# Patient Record
Sex: Female | Born: 1955 | Race: White | Hispanic: No | Marital: Married | Smoking: Never smoker
Health system: Southern US, Community
[De-identification: ages and names within clinical notes are randomized; demographics above are authoritative.]

## PROBLEM LIST (undated history)

## (undated) DIAGNOSIS — T7840XA Allergy, unspecified, initial encounter: Secondary | ICD-10-CM

## (undated) DIAGNOSIS — H409 Unspecified glaucoma: Secondary | ICD-10-CM

## (undated) DIAGNOSIS — E0500 Thyrotoxicosis with diffuse goiter without thyrotoxic crisis or storm: Secondary | ICD-10-CM

## (undated) HISTORY — DX: Allergy, unspecified, initial encounter: T78.40XA

## (undated) HISTORY — PX: COLONOSCOPY: SHX174

## (undated) HISTORY — PX: CARPAL TUNNEL RELEASE: SHX101

## (undated) HISTORY — DX: Thyrotoxicosis with diffuse goiter without thyrotoxic crisis or storm: E05.00

## (undated) HISTORY — DX: Unspecified glaucoma: H40.9

---
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
## 1982-02-07 HISTORY — PX: THORACOTOMY: SUR1349

## 1999-07-23 ENCOUNTER — Encounter: Payer: Self-pay | Admitting: Emergency Medicine

## 1999-07-23 ENCOUNTER — Emergency Department (HOSPITAL_COMMUNITY): Admission: EM | Admit: 1999-07-23 | Discharge: 1999-07-23 | Payer: Self-pay | Admitting: Emergency Medicine

## 2006-01-13 ENCOUNTER — Ambulatory Visit: Payer: Self-pay | Admitting: Internal Medicine

## 2006-01-13 LAB — CONVERTED CEMR LAB
ALT: 24 units/L (ref 0–40)
AST: 26 units/L (ref 0–37)
Albumin: 3.5 g/dL (ref 3.5–5.2)
Alkaline Phosphatase: 52 units/L (ref 39–117)
BUN: 9 mg/dL (ref 6–23)
Basophils Absolute: 0 10*3/uL (ref 0.0–0.1)
Basophils Relative: 0.5 % (ref 0.0–1.0)
CO2: 25 meq/L (ref 19–32)
Calcium: 9.2 mg/dL (ref 8.4–10.5)
Chloride: 106 meq/L (ref 96–112)
Chol/HDL Ratio, serum: 3.4
Cholesterol: 127 mg/dL (ref 0–200)
Creatinine, Ser: 0.6 mg/dL (ref 0.4–1.2)
Eosinophil percent: 1.3 % (ref 0.0–5.0)
GFR calc non Af Amer: 113 mL/min
Glomerular Filtration Rate, Af Am: 137 mL/min/{1.73_m2}
Glucose, Bld: 92 mg/dL (ref 70–99)
HCT: 42.1 % (ref 36.0–46.0)
HDL: 37.3 mg/dL — ABNORMAL LOW (ref >39.0)
Hemoglobin: 14.3 g/dL (ref 12.0–15.0)
LDL Cholesterol: 79 mg/dL (ref 0–99)
Lymphocytes Relative: 32 % (ref 12.0–46.0)
MCHC: 33.9 g/dL (ref 30.0–36.0)
MCV: 89.6 fL (ref 78.0–100.0)
Monocytes Absolute: 0.5 10*3/uL (ref 0.2–0.7)
Monocytes Relative: 9.2 % (ref 3.0–11.0)
Neutro Abs: 3.2 10*3/uL (ref 1.4–7.7)
Neutrophils Relative %: 57 % (ref 43.0–77.0)
Platelets: 298 10*3/uL (ref 150–400)
Potassium: 4 meq/L (ref 3.5–5.1)
RBC: 4.7 M/uL (ref 3.87–5.11)
RDW: 12 % (ref 11.5–14.6)
Sodium: 138 meq/L (ref 135–145)
TSH: 0.01 microintl units/mL — ABNORMAL LOW (ref 0.35–5.50)
Total Bilirubin: 1 mg/dL (ref 0.3–1.2)
Total Protein: 6.6 g/dL (ref 6.0–8.3)
Triglyceride fasting, serum: 53 mg/dL (ref 0–149)
VLDL: 11 mg/dL (ref 0–40)
WBC: 5.6 10*3/uL (ref 4.5–10.5)

## 2006-01-20 ENCOUNTER — Ambulatory Visit: Payer: Self-pay | Admitting: Internal Medicine

## 2006-02-02 ENCOUNTER — Encounter (HOSPITAL_COMMUNITY): Admission: RE | Admit: 2006-02-02 | Discharge: 2006-04-18 | Payer: Self-pay | Admitting: Internal Medicine

## 2006-02-14 ENCOUNTER — Ambulatory Visit: Payer: Self-pay | Admitting: Internal Medicine

## 2006-03-03 ENCOUNTER — Ambulatory Visit: Payer: Self-pay | Admitting: Internal Medicine

## 2006-03-03 LAB — CONVERTED CEMR LAB
Basophils Absolute: 0 10*3/uL (ref 0.0–0.1)
Basophils Relative: 0.3 % (ref 0.0–1.0)
Eosinophils Absolute: 0.1 10*3/uL (ref 0.0–0.6)
Eosinophils Relative: 1.8 % (ref 0.0–5.0)
Free T4: 1.7 ng/dL — ABNORMAL HIGH (ref 0.6–1.6)
HCT: 45 % (ref 36.0–46.0)
Hemoglobin: 15.5 g/dL — ABNORMAL HIGH (ref 12.0–15.0)
Lymphocytes Relative: 23.4 % (ref 12.0–46.0)
MCHC: 34.5 g/dL (ref 30.0–36.0)
MCV: 87.9 fL (ref 78.0–100.0)
Monocytes Absolute: 0.5 10*3/uL (ref 0.2–0.7)
Monocytes Relative: 8.1 % (ref 3.0–11.0)
Neutro Abs: 4.1 10*3/uL (ref 1.4–7.7)
Neutrophils Relative %: 66.4 % (ref 43.0–77.0)
Platelets: 281 10*3/uL (ref 150–400)
RBC: 5.12 M/uL — ABNORMAL HIGH (ref 3.87–5.11)
RDW: 11.7 % (ref 11.5–14.6)
TSH: 0.01 microintl units/mL — ABNORMAL LOW (ref 0.35–5.50)
WBC: 6.1 10*3/uL (ref 4.5–10.5)

## 2006-03-13 ENCOUNTER — Ambulatory Visit: Payer: Self-pay | Admitting: Internal Medicine

## 2006-03-31 ENCOUNTER — Ambulatory Visit: Payer: Self-pay | Admitting: Internal Medicine

## 2006-03-31 LAB — CONVERTED CEMR LAB
Free T4: 1.4 ng/dL (ref 0.6–1.6)
T3, Free: 5.6 pg/mL — ABNORMAL HIGH (ref 2.3–4.2)
TSH: 0.05 microintl units/mL — ABNORMAL LOW (ref 0.35–5.50)

## 2006-04-04 ENCOUNTER — Ambulatory Visit: Payer: Self-pay | Admitting: Internal Medicine

## 2006-05-05 ENCOUNTER — Ambulatory Visit: Payer: Self-pay | Admitting: Internal Medicine

## 2006-05-05 LAB — CONVERTED CEMR LAB
Free T4: 1.1 ng/dL (ref 0.6–1.6)
T3, Free: 4.4 pg/mL — ABNORMAL HIGH (ref 2.3–4.2)
TSH: 0.03 u[IU]/mL — ABNORMAL LOW (ref 0.35–5.50)

## 2006-06-27 ENCOUNTER — Ambulatory Visit: Payer: Self-pay | Admitting: Internal Medicine

## 2006-06-27 LAB — CONVERTED CEMR LAB: TSH: 0.05 microintl units/mL — ABNORMAL LOW (ref 0.35–5.50)

## 2006-06-30 ENCOUNTER — Ambulatory Visit: Payer: Self-pay | Admitting: Internal Medicine

## 2006-08-01 ENCOUNTER — Ambulatory Visit: Payer: Self-pay | Admitting: Internal Medicine

## 2006-08-01 LAB — CONVERTED CEMR LAB
Free T4: 0.6 ng/dL (ref 0.6–1.6)
T3, Free: 3.2 pg/mL (ref 2.3–4.2)
TSH: 0.04 microintl units/mL — ABNORMAL LOW (ref 0.35–5.50)

## 2006-08-08 ENCOUNTER — Ambulatory Visit: Payer: Self-pay | Admitting: Internal Medicine

## 2006-09-20 ENCOUNTER — Ambulatory Visit: Payer: Self-pay | Admitting: Internal Medicine

## 2006-09-22 LAB — CONVERTED CEMR LAB
Free T4: 0.5 ng/dL — ABNORMAL LOW (ref 0.6–1.6)
T3, Free: 2.8 pg/mL (ref 2.3–4.2)
TSH: 0.04 microintl units/mL — ABNORMAL LOW (ref 0.35–5.50)

## 2006-11-13 ENCOUNTER — Ambulatory Visit: Payer: Self-pay | Admitting: Internal Medicine

## 2006-11-13 LAB — CONVERTED CEMR LAB
Free T4: 0.4 ng/dL — ABNORMAL LOW (ref 0.6–1.6)
T3, Free: 2.4 pg/mL (ref 2.3–4.2)
TSH: 2.33 microintl units/mL (ref 0.35–5.50)

## 2006-11-21 ENCOUNTER — Ambulatory Visit: Payer: Self-pay | Admitting: Internal Medicine

## 2006-12-18 ENCOUNTER — Ambulatory Visit: Payer: Self-pay | Admitting: Internal Medicine

## 2006-12-19 LAB — CONVERTED CEMR LAB
Free T4: 0.7 ng/dL (ref 0.6–1.6)
T3, Free: 2.9 pg/mL (ref 2.3–4.2)
TSH: 0.5 microintl units/mL (ref 0.35–5.50)

## 2007-01-15 ENCOUNTER — Ambulatory Visit: Payer: Self-pay | Admitting: Internal Medicine

## 2007-01-15 LAB — CONVERTED CEMR LAB
Free T4: 0.9 ng/dL (ref 0.6–1.6)
T3, Free: 3.5 pg/mL (ref 2.3–4.2)
TSH: 0.1 microintl units/mL — ABNORMAL LOW (ref 0.35–5.50)

## 2007-01-22 ENCOUNTER — Ambulatory Visit: Payer: Self-pay | Admitting: Internal Medicine

## 2007-01-24 DIAGNOSIS — E0500 Thyrotoxicosis with diffuse goiter without thyrotoxic crisis or storm: Secondary | ICD-10-CM

## 2007-01-24 HISTORY — DX: Thyrotoxicosis with diffuse goiter without thyrotoxic crisis or storm: E05.00

## 2007-03-05 ENCOUNTER — Ambulatory Visit: Payer: Self-pay | Admitting: Internal Medicine

## 2007-03-06 LAB — CONVERTED CEMR LAB: Free T4: 0.7 ng/dL (ref 0.6–1.6)

## 2007-05-01 ENCOUNTER — Ambulatory Visit: Payer: Self-pay | Admitting: Internal Medicine

## 2007-05-01 LAB — CONVERTED CEMR LAB: T3, Free: 3 pg/mL (ref 2.3–4.2)

## 2007-05-09 ENCOUNTER — Ambulatory Visit: Payer: Self-pay | Admitting: Internal Medicine

## 2007-06-20 ENCOUNTER — Ambulatory Visit: Payer: Self-pay | Admitting: Internal Medicine

## 2007-06-25 LAB — CONVERTED CEMR LAB
Free T4: 1 ng/dL (ref 0.6–1.6)
T3, Free: 3.6 pg/mL (ref 2.3–4.2)
TSH: 0.08 microintl units/mL — ABNORMAL LOW (ref 0.35–5.50)

## 2007-07-03 ENCOUNTER — Ambulatory Visit: Payer: Self-pay | Admitting: Internal Medicine

## 2007-07-04 LAB — CONVERTED CEMR LAB: TSH: 0.04 microintl units/mL — ABNORMAL LOW (ref 0.35–5.50)

## 2007-08-27 ENCOUNTER — Ambulatory Visit: Payer: Self-pay | Admitting: Internal Medicine

## 2007-09-04 ENCOUNTER — Ambulatory Visit: Payer: Self-pay | Admitting: Internal Medicine

## 2007-09-04 ENCOUNTER — Telehealth: Payer: Self-pay | Admitting: Internal Medicine

## 2007-11-12 ENCOUNTER — Encounter: Payer: Self-pay | Admitting: Internal Medicine

## 2007-11-12 ENCOUNTER — Ambulatory Visit: Payer: Self-pay | Admitting: Internal Medicine

## 2007-11-28 ENCOUNTER — Ambulatory Visit: Payer: Self-pay | Admitting: Internal Medicine

## 2007-11-28 LAB — CONVERTED CEMR LAB: TSH: 0.13 microintl units/mL — ABNORMAL LOW (ref 0.35–5.50)

## 2007-12-04 ENCOUNTER — Ambulatory Visit: Payer: Self-pay | Admitting: Internal Medicine

## 2008-02-26 ENCOUNTER — Ambulatory Visit: Payer: Self-pay | Admitting: Internal Medicine

## 2008-02-26 LAB — CONVERTED CEMR LAB
Free T4: 1 ng/dL (ref 0.6–1.6)
T3, Free: 3.2 pg/mL (ref 2.3–4.2)
TSH: 0.06 microintl units/mL — ABNORMAL LOW (ref 0.35–5.50)

## 2008-03-04 ENCOUNTER — Ambulatory Visit: Payer: Self-pay | Admitting: Internal Medicine

## 2008-08-12 ENCOUNTER — Ambulatory Visit: Payer: Self-pay | Admitting: Internal Medicine

## 2008-08-12 LAB — CONVERTED CEMR LAB: TSH: 0.04 microintl units/mL — ABNORMAL LOW (ref 0.35–5.50)

## 2008-08-25 ENCOUNTER — Ambulatory Visit: Payer: Self-pay | Admitting: Internal Medicine

## 2008-09-17 ENCOUNTER — Ambulatory Visit: Payer: Self-pay | Admitting: Gastroenterology

## 2008-09-29 ENCOUNTER — Ambulatory Visit: Payer: Self-pay | Admitting: Gastroenterology

## 2008-09-29 ENCOUNTER — Encounter: Payer: Self-pay | Admitting: Gastroenterology

## 2008-10-01 ENCOUNTER — Encounter: Payer: Self-pay | Admitting: Internal Medicine

## 2008-10-08 ENCOUNTER — Encounter: Payer: Self-pay | Admitting: Gastroenterology

## 2009-02-12 ENCOUNTER — Encounter: Payer: Self-pay | Admitting: Internal Medicine

## 2009-02-23 ENCOUNTER — Ambulatory Visit: Payer: Self-pay | Admitting: Internal Medicine

## 2009-02-27 ENCOUNTER — Ambulatory Visit: Payer: Self-pay | Admitting: Internal Medicine

## 2009-03-20 ENCOUNTER — Ambulatory Visit: Payer: Self-pay | Admitting: Internal Medicine

## 2009-04-14 ENCOUNTER — Telehealth: Payer: Self-pay | Admitting: Internal Medicine

## 2009-04-27 ENCOUNTER — Encounter: Payer: Self-pay | Admitting: Internal Medicine

## 2009-05-26 ENCOUNTER — Ambulatory Visit: Payer: Self-pay | Admitting: Internal Medicine

## 2009-05-27 LAB — CONVERTED CEMR LAB
Free T4: 0.9 ng/dL (ref 0.6–1.6)
T3, Free: 3.2 pg/mL (ref 2.3–4.2)
TSH: 0.21 microintl units/mL — ABNORMAL LOW (ref 0.35–5.50)

## 2009-08-18 ENCOUNTER — Telehealth (INDEPENDENT_AMBULATORY_CARE_PROVIDER_SITE_OTHER): Payer: Self-pay | Admitting: *Deleted

## 2009-08-24 ENCOUNTER — Ambulatory Visit: Payer: Self-pay | Admitting: Internal Medicine

## 2009-08-24 LAB — CONVERTED CEMR LAB
Cholesterol: 173 mg/dL (ref 0–200)
Free T4: 0.73 ng/dL (ref 0.60–1.60)
T3, Free: 2.7 pg/mL (ref 2.3–4.2)
TSH: 1.51 microintl units/mL (ref 0.35–5.50)

## 2009-08-25 ENCOUNTER — Emergency Department (HOSPITAL_COMMUNITY): Admission: EM | Admit: 2009-08-25 | Discharge: 2009-08-25 | Payer: Self-pay | Admitting: Emergency Medicine

## 2009-08-25 ENCOUNTER — Telehealth: Payer: Self-pay | Admitting: Internal Medicine

## 2009-09-09 ENCOUNTER — Ambulatory Visit: Payer: Self-pay | Admitting: Internal Medicine

## 2009-09-18 ENCOUNTER — Telehealth: Payer: Self-pay | Admitting: *Deleted

## 2009-10-13 ENCOUNTER — Ambulatory Visit: Payer: Self-pay | Admitting: Internal Medicine

## 2009-12-14 ENCOUNTER — Ambulatory Visit: Payer: Self-pay | Admitting: Internal Medicine

## 2009-12-14 LAB — CONVERTED CEMR LAB
T3, Free: 2.7 pg/mL (ref 2.3–4.2)
TSH: 0.57 microintl units/mL (ref 0.35–5.50)

## 2009-12-21 ENCOUNTER — Ambulatory Visit: Payer: Self-pay | Admitting: Internal Medicine

## 2010-01-20 ENCOUNTER — Encounter: Payer: Self-pay | Admitting: Internal Medicine

## 2010-02-11 ENCOUNTER — Encounter: Payer: Self-pay | Admitting: Internal Medicine

## 2010-03-09 NOTE — Progress Notes (Signed)
Summary: hand surgeon referral  Phone Note Call from Patient Call back at [phone]   Caller: vm Complaint: Headache Summary of Call: Carpal tunnel much worse.  Referral to nice & good hand surgeon requested.   Initial call taken by: Rudy Jew, RN,  April 14, 2009 1:04 PM  Follow-up for Phone Call        [place] [facility] dr Merlyn Lot dr sypher Follow-up by: Birdie Sons MD,  April 14, 2009 2:25 PM  Additional Follow-up for Phone Call Additional follow up Details #1::        Patient prefers Dr. Teressa Senter if available.   Additional Follow-up by: Rudy Jew, RN,  April 14, 2009 2:45 PM

## 2010-03-09 NOTE — Assessment & Plan Note (Signed)
Summary: follow up/cjr pt rsc/njr//pt rescd//ccm rsc appt time/njr   Vital Signs:  Patient profile:   55 year old female Weight:      138 pounds Temp:     98.7 degrees F oral Pulse rate:   56 / minute Pulse rhythm:   regular Resp:     12 per minute BP sitting:   122 / 66  (left arm) Cuff size:   regular  Vitals Entered By: Gladis Riffle, RN (September 09, 2009 9:33 AM) CC: FU, labs done--discuss poison ivy Is Patient Diabetic? No   CC:  FU and labs done--discuss poison ivy.  History of Present Illness:  Follow-Up Visit      This is a 55 year old woman who presents for Follow-up visit.  The patient denies chest pain and palpitations.  Since the last visit the patient notes no new problems or concerns--reaction to presumed poison ivy.  The patient reports taking meds as prescribed.  When questioned about possible medication side effects, the patient notes none.    All other systems reviewed and were negative   Preventive Screening-Counseling & Management  Alcohol-Tobacco     Smoking Status: never  Current Problems (verified): 1)  Carpal Tunnel Syndrome  (ICD-354.0) 2)  Special Screening For Malignant Neoplasms Colon  (ICD-V76.51) 3)  Hyperthyroidism  (ICD-242.90) 4)  Grave's Disease  (ICD-242.00)  Current Medications (verified): 1)  Claritin 10 Mg Tabs (Loratadine) .... 2 Tablets By Mouth Once A Day While Has Poison Ivy, Then Back To One A Day 2)  Glucosamine Sulfate 500 Mg Caps (Glucosamine Sulfate) .... Take 2 Capsule By Mouth Once A Day 3)  Loestrin 1.5/30 (21) 1.5-30 Mg-Mcg Tabs (Norethindrone Acet-Ethinyl Est) .... Take 1 Tablet By Mouth Once A Day 4)  Multivitamins   Tabs (Multiple Vitamin) .... Once Daily 5)  Restasis 0.05 %  Emul (Cyclosporine) .... One Drop Each Eye Once A Day 6)  Tapazole 10 Mg  Tabs (Methimazole) .[name].. 1 and 1/2 By Mouth Once Daily or As Directed 7)  Caltrate 600+d 600-400 Mg-Unit Tabs (Calcium Carbonate-Vitamin D) .... Once Daily 8)  L-Lysine Hcl  500 Mg Tabs (Lysine Hcl) .... Once Daily 9)  Benadryl 25 Mg Caps (Diphenhydramine Hcl) .... One By Mouth Daily 10)  Fish Oil 500 Mg Caps (Omega-3 Fatty Acids) .[name].. 1200 Mg Bid 11)  Aleve 220 Mg Tabs (Naproxen Sodium) .... 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   Eyes:  pupils equal and pupils round.   Ears:  R ear normal and L ear normal.   Neck:  enlarged thyroid diffusely Chest Wall:  No deformities, masses, or tenderness noted. Heart:  normal rate and regular rhythm.   Abdomen:  soft and non-tender.   Msk:  No deformity or scoliosis noted of thoracic or lumbar spine.   Neurologic:  cranial nerves II-XII intact and gait normal.   Skin:  turgor normal and color normal.   Psych:  normally interactive and good eye contact.     Complete Medication List: 1)  Claritin 10 Mg Tabs (Loratadine) .Marland KitchenMarland KitchenMarland Kitchen  2 tablets by mouth once a day while has poison ivy, then back to one a day 2)  Glucosamine Sulfate 500 Mg Caps (Glucosamine sulfate) .... Take 2 capsule by mouth once a day 3)  Loestrin 1.5/30 (21) 1.5-30 Mg-mcg Tabs (Norethindrone acet-ethinyl est) .... Take 1 tablet by mouth once a day 4)  Multivitamins Tabs (Multiple vitamin) .... Once daily 5)  Restasis 0.05 % Emul (Cyclosporine) .... One drop each eye once a day 6)  Tapazole 10 Mg Tabs (Methimazole) .... Take 1 tablet by mouth once a day 7)  Caltrate  600+d 600-400 Mg-unit Tabs (Calcium carbonate-vitamin d) .... Once daily 8)  L-lysine Hcl 500 Mg Tabs (Lysine hcl) .... Once daily 9)  Benadryl 25 Mg Caps (Diphenhydramine hcl) .... One by mouth daily 10)  Fish Oil 500 Mg Caps (Omega-3 fatty acids) .[name].. 1200 mg bid 11)  Aleve 220 Mg Tabs (Naproxen sodium) .... Take 1 tablet by mouth two times a day  Other Orders: Tdap => 74yrs IM (16109) Admin 1st Vaccine (60454)  Patient Instructions: 1)  Please schedule a follow-up appointment in 3 months. 2)  tsh  3)  free T4 4)  free T3 5)  241.9   Immunizations Administered:  Tetanus Vaccine:    Vaccine Type: Tdap    Site: right deltoid    Mfr: [employer]    Dose: 0.5 ml    Route: IM    Given by: Gladis Riffle, RN    Exp. Date: 02/26/2011    Lot #: [id number]

## 2010-03-09 NOTE — Consult Note (Signed)
Summary: [facility]  [facility]   Imported By: Maryln Gottron 05/05/2009 15:44:41  _____________________________________________________________________  External Attachment:    Type:   Image     Comment:   External Document

## 2010-03-09 NOTE — Progress Notes (Signed)
Summary: Pt req to add cholesterol lvl to thyroid labs  Phone Note Call from Patient Call back at Home Phone [phone]   Caller: Patient Summary of Call: Pt req to have cholesterol lvl checked and added to her thyroids labs.   Pt coming in for TSH on 08/24/09.    Initial call taken by: Lucy Antigua,  August 18, 2009 3:00 PM  Follow-up for Phone Call        Added Fasting Lipid to labs on 08/24/09.  Left a message for pt to return my call to inform her of this and that pt needs to be fasting. Follow-up by: Josph Macho RMA,  August 18, 2009 3:59 PM

## 2010-03-09 NOTE — Progress Notes (Signed)
Summary: refill  Phone Note Call from Patient Call back at Home Phone [phone]   Caller: Patient Summary of Call: refill on tapazole Initial call taken by: Romualdo Bolk, CMA (AAMA),  September 18, 2009 11:57 AM    Prescriptions: TAPAZOLE 10 MG  TABS (METHIMAZOLE) Take 1 tablet by mouth once a day  #90 x 3   Entered by:   Romualdo Bolk, CMA (AAMA)   Authorized by:   Birdie Sons MD   Signed by:   Romualdo Bolk, CMA (AAMA) on 09/18/2009   Method used:   Electronically to        [facility]* (retail)             ,          Ph: [phone]       Fax: [phone]   RxID:   [id number]

## 2010-03-09 NOTE — Miscellaneous (Signed)
Summary: flu shot  Clinical Lists Changes  Observations: Added new observation of FLU VAX: fluvirin (02/12/2009 14:05)      Immunization History:  Influenza Immunization History:    Influenza:  fluvirin (02/12/2009)

## 2010-03-09 NOTE — Assessment & Plan Note (Signed)
Summary: 3 month rov/njr   Vital Signs:  Patient profile:   55 year old female Weight:      141 pounds Temp:     98.8 degrees F oral Pulse rate:   72 / minute Pulse rhythm:   regular BP sitting:   112 / 66  (left arm) Cuff size:   regular  Vitals Entered By: Sydell Axon LPN (December 21, 2009 9:54 AM) CC: 3 Month follow-up   CC:  3 Month follow-up.  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 HEENT exam atraumatic, normocephalic. Neck is supple. She does have a diffusely enlarged thyroid gland. Chest clear auscultation cardiac exam S1-S2 are regular. Extremities with no clubbing cyanosis or edema.   Impression & Recommendations:  Problem # 1:  HYPERTHYROIDISM (ICD-242.90) reviewed labs with the patient's. Thyroid panel is now normal. We'll recheck in 4 months. Continue same dose Tapazole Her updated medication list for this problem includes:    Tapazole 10 Mg Tabs (Methimazole) .[name]... Take 1 tablet by mouth once a day  Labs Reviewed: TSH: 0.57 (12/14/2009)     Complete Medication List: 1)  Claritin 10 Mg Tabs (Loratadine) .... Take one by mouth daily 2)  Glucosamine Sulfate 500 Mg Caps (Glucosamine sulfate) .... Take 2 capsule by mouth once a day 3)  Loestrin 1.5/30 (21) 1.5-30 Mg-mcg Tabs (Norethindrone acet-ethinyl est) .... Take 1 tablet by mouth once a day 4)  Multivitamins Tabs (Multiple vitamin) .... Once daily 5)  Restasis 0.05 % Emul (Cyclosporine) .... One drop each eye once a day 6)  Tapazole 10 Mg Tabs  (Methimazole) .... Take 1 tablet by mouth once a day 7)  Caltrate 600+d 600-400 Mg-unit Tabs (Calcium carbonate-vitamin d) .... Once daily 8)  L-lysine Hcl 500 Mg Tabs (Lysine hcl) .... Once daily 9)  Benadryl 25 Mg Caps (Diphenhydramine hcl) .... One by mouth daily 10)  Fish Oil 500 Mg Caps (Omega-3 fatty acids) .[name].. 1200 mg bid 11)  Clobetasol Propionate 0.05 % Soln (Clobetasol propionate) .... Use as directed  Patient Instructions: 1)  4 months 2)  tsh---244.9 3)  free T3 4)  free T4   Orders Added: 1)  Est. Patient Level III [14431]    Current Allergies (reviewed today): No known allergies

## 2010-03-09 NOTE — Assessment & Plan Note (Signed)
Summary: 6 MONTH ROV/NJR/PT RSC FROM BMP/CJR   Vital Signs:  Patient profile:   55 year old female Weight:      137 pounds Temp:     98.7 degrees F Resp:     12 per minute BP sitting:   132 / 74  Vitals Entered By: Lynann Beaver CMA (February 27, 2009 11:57 AM) CC: rov Is Patient Diabetic? No   CC:  rov.  History of Present Illness:  Follow-Up Visit      This is a 55 year old woman who presents for Follow-up visit.  The patient denies chest pain.  Since the last visit the patient notes no new problems or concerns.  The patient reports taking meds as prescribed.  When questioned about possible medication side effects, the patient notes none.   All other systems reviewed and were negative   Current Medications (verified): 1)  Claritin 10 Mg Tabs (Loratadine) .[name].. 1 Tablet By Mouth Once A Day 2)  Glucosamine Sulfate 500 Mg Caps (Glucosamine Sulfate) .... Take 2 Capsule By Mouth Once A Day 3)  Loestrin 1.5/30 (21) 1.5-30 Mg-Mcg Tabs (Norethindrone Acet-Ethinyl Est) .... Take 1 Tablet By Mouth Once A Day 4)  Multivitamins   Tabs (Multiple Vitamin) .... Once Daily 5)  Restasis 0.05 %  Emul (Cyclosporine) .... One Drop Each Eye Once A Day 6)  Tapazole 10 Mg  Tabs (Methimazole) .[name].. 1 and 1/2 By Mouth Once Daily or As Directed 7)  Caltrate 600+d 600-400 Mg-Unit Tabs (Calcium Carbonate-Vitamin D) .... Once Daily 8)  L-Lysine Hcl 500 Mg Tabs (Lysine Hcl) .... Once Daily 9)  Benadryl 25 Mg Caps (Diphenhydramine Hcl) .... One By Mouth Daily 10)  Fish Oil 500 Mg Caps (Omega-3 Fatty Acids) .[name].. 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   Eyes:  pupils equal and pupils round.   Ears:  R ear normal and L ear normal.   Neck:  No deformities, masses, or tenderness noted. thyroid diffusely enlarged Chest Wall:  No deformities, masses, or tenderness noted. Lungs:  Normal respiratory effort, chest expands symmetrically. Lungs are clear to auscultation, no crackles or wheezes. Heart:  Normal rate and regular rhythm. S1 and S2 normal without gallop, murmur, click, rub or other extra sounds. Abdomen:  soft and non-tender.     Impression & Recommendations:  Problem # 1:  HYPERTHYROIDISM (ICD-242.90) improving on meds continue current medications  Her updated medication list for this problem includes:    Tapazole 10 Mg Tabs (Methimazole) .[name]... 1 and 1/2 by mouth once daily or as directed  she alternates 15 mg with 10 mg   Complete Medication List: 1)  Claritin 10 Mg Tabs (Loratadine) .[name].. 1 tablet by mouth once a day 2)  Glucosamine Sulfate 500 Mg Caps (Glucosamine sulfate) .... Take 2 capsule by mouth once a day 3)  Loestrin 1.5/30 (21) 1.5-30 Mg-mcg Tabs (Norethindrone acet-ethinyl est) .... Take 1 tablet by mouth once a day 4)  Multivitamins Tabs (Multiple vitamin) .... Once daily 5)  Restasis 0.05 % Emul (Cyclosporine) .... One drop each eye once a day  6)  Tapazole 10 Mg Tabs (Methimazole) .[name].. 1 and 1/2 by mouth once daily or as directed 7)  Caltrate 600+d 600-400 Mg-unit Tabs (Calcium carbonate-vitamin d) .... Once daily 8)  L-lysine Hcl 500 Mg Tabs (Lysine hcl) .... Once daily 9)  Benadryl 25 Mg Caps (Diphenhydramine hcl) .... One by mouth daily 10)  Fish Oil 500 Mg Caps (Omega-3 fatty acids) .[name].. 1200 mg bid  Patient Instructions: 1)  Please schedule a follow-up appointment in 3 months. labs only 2)   tsh 3)  FT4 4)  FT3

## 2010-03-09 NOTE — Assessment & Plan Note (Signed)
Summary: CARPAL TUNNEL F/U // RS   Vital Signs:  Patient profile:   55 year old female Weight:      140 pounds Temp:     98.9 degrees F Pulse rate:   68 / minute Resp:     12 per minute BP sitting:   96 / 78  (left arm)  Vitals Entered By: Gladis Riffle, RN (March 20, 2009 1:32 PM) CC: FU carpal tunnel, left is resolved, right better but still painful Is Patient Diabetic? No   CC:  FU carpal tunnel, left is resolved, and right better but still painful.  History of Present Illness: R>L arm sxs of tinlgling. especially nocturnal. Distribution: median nerve no weakness sxs are much better after wearing wrist braces for 3 weeks. L arm sxs essentially resolved discomfor 4/10 at most  All other systems reviewed and were negative   Preventive Screening-Counseling & Management  Alcohol-Tobacco     Smoking Status: never  Medications Prior to Update: 1)  Claritin 10 Mg Tabs (Loratadine) .[name].. 1 Tablet By Mouth Once A Day 2)  Glucosamine Sulfate 500 Mg Caps (Glucosamine Sulfate) .... Take 2 Capsule By Mouth Once A Day 3)  Loestrin 1.5/30 (21) 1.5-30 Mg-Mcg Tabs (Norethindrone Acet-Ethinyl Est) .... Take 1 Tablet By Mouth Once A Day 4)  Multivitamins   Tabs (Multiple Vitamin) .... Once Daily 5)  Restasis 0.05 %  Emul (Cyclosporine) .... One Drop Each Eye Once A Day 6)  Tapazole 10 Mg  Tabs (Methimazole) .[name].. 1 and 1/2 By Mouth Once Daily or As Directed 7)  Caltrate 600+d 600-400 Mg-Unit Tabs (Calcium Carbonate-Vitamin D) .... Once Daily 8)  L-Lysine Hcl 500 Mg Tabs (Lysine Hcl) .... Once Daily 9)  Benadryl 25 Mg Caps (Diphenhydramine Hcl) .... One By Mouth Daily 10)  Fish Oil 500 Mg Caps (Omega-3 Fatty Acids) .[name].. 1200 Mg Bid  Current Medications (verified): 1)  Claritin 10 Mg Tabs (Loratadine) .[name].. 1 Tablet By Mouth Once A Day 2)  Glucosamine Sulfate 500 Mg Caps (Glucosamine Sulfate) .... Take 2 Capsule By Mouth Once A Day 3)  Loestrin 1.5/30 (21) 1.5-30 Mg-Mcg Tabs (Norethindrone  Acet-Ethinyl Est) .... Take 1 Tablet By Mouth Once A Day 4)  Multivitamins   Tabs (Multiple Vitamin) .... Once Daily 5)  Restasis 0.05 %  Emul (Cyclosporine) .... One Drop Each Eye Once A Day 6)  Tapazole 10 Mg  Tabs (Methimazole) .[name].. 1 and 1/2 By Mouth Once Daily or As Directed 7)  Caltrate 600+d 600-400 Mg-Unit Tabs (Calcium Carbonate-Vitamin D) .... Once Daily 8)  L-Lysine Hcl 500 Mg Tabs (Lysine Hcl) .... Once Daily 9)  Benadryl 25 Mg Caps (Diphenhydramine Hcl) .... One By Mouth Daily 10)  Fish Oil 500 Mg Caps (Omega-3 Fatty Acids) .[name].. 1200 Mg Bid 11)  Aleve 220 Mg Tabs (Naproxen Sodium) .... 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   Msk:  FROM both shoulders and wrists Neurologic:  +tinnels sign bilaterally  Impression & Recommendations:  Problem # 1:  CARPAL TUNNEL SYNDROME (ICD-354.0) advised continued wrist brace sxs have improved dramatically if not "cured"in another 3-4 weeks she will call and I'll send to ortho-hand  Complete Medication List: 1)  Claritin 10 Mg Tabs (Loratadine) .[name].. 1 tablet by mouth once a day 2)  Glucosamine Sulfate 500 Mg Caps (Glucosamine sulfate) .... Take 2 capsule by mouth once a day 3)  Loestrin 1.5/30 (21) 1.5-30 Mg-mcg Tabs (Norethindrone acet-ethinyl est) .... Take 1 tablet by  mouth once a day 4)  Multivitamins Tabs (Multiple vitamin) .... Once daily 5)  Restasis 0.05 % Emul (Cyclosporine) .... One drop each eye once a day 6)  Tapazole 10 Mg Tabs (Methimazole) .[name].. 1 and 1/2 by mouth once daily or as directed 7)  Caltrate 600+d 600-400 Mg-unit Tabs (Calcium carbonate-vitamin d) .... Once daily 8)  L-lysine Hcl 500 Mg Tabs (Lysine hcl) .... Once daily 9)  Benadryl 25 Mg Caps (Diphenhydramine hcl) .... One by mouth daily 10)  Fish Oil 500 Mg Caps (Omega-3 fatty acids) .[name].. 1200 mg bid 11)  Aleve 220 Mg Tabs (Naproxen sodium) .... Take 1 tablet by mouth two times a day

## 2010-03-09 NOTE — Assessment & Plan Note (Signed)
Summary: RASH ? RELATED TO THYROID/CJR   Vital Signs:  Patient profile:   55 year old female Height:      64.5 inches Weight:      138 pounds BMI:     23.41 Temp:     98.7 degrees F oral BP sitting:   110 / 70  (left arm) Cuff size:   regular  Vitals Entered By: Kern Reap CMA Duncan Dull) (October 13, 2009 8:46 AM) CC: thyroid concerns Is Patient Diabetic? No Pain Assessment Patient in pain? no        CC:  thyroid concerns.  History of Present Illness: pruritic rash for 2 months started with typical contact dermatitis---treated with a lot of different topical creams (no oral steroids) started clobetasol---some improvement past 1 week biopsy performed friday  Current Medications (verified): 1)  Claritin 10 Mg Tabs (Loratadine) .... 2 Tablets By Mouth Once A Day While Has Poison Ivy, Then Back To One A Day 2)  Glucosamine Sulfate 500 Mg Caps (Glucosamine Sulfate) .... Take 2 Capsule By Mouth Once A Day 3)  Loestrin 1.5/30 (21) 1.5-30 Mg-Mcg Tabs (Norethindrone Acet-Ethinyl Est) .... Take 1 Tablet By Mouth Once A Day 4)  Multivitamins   Tabs (Multiple Vitamin) .... Once Daily 5)  Restasis 0.05 %  Emul (Cyclosporine) .... One Drop Each Eye Once A Day 6)  Tapazole 10 Mg  Tabs (Methimazole) .... Take 1 Tablet By Mouth Once A Day 7)  Caltrate 600+d 600-400 Mg-Unit Tabs (Calcium Carbonate-Vitamin D) .... Once Daily 8)  L-Lysine Hcl 500 Mg Tabs (Lysine Hcl) .... Once Daily 9)  Benadryl 25 Mg Caps (Diphenhydramine Hcl) .... One By Mouth Daily 10)  Fish Oil 500 Mg Caps (Omega-3 Fatty Acids) .[name].. 1200 Mg Bid 11)  Aleve 220 Mg Tabs (Naproxen Sodium) .... Take 1 Tablet By Mouth Two Times A Day 12)  Clobetasol Propionate 0.05 % Soln (Clobetasol Propionate) .... Use As Directed  Allergies: No Known Drug Allergies  Review of Systems       Flu Vaccine Consent Questions     Do you have a history of severe allergic reactions to this vaccine? no    Any prior history of allergic  reactions to egg and/or gelatin? no    Do you have a sensitivity to the preservative Thimersol? no    Do you have a past history of Guillan-Barre Syndrome? no    Do you currently have an acute febrile illness? no    Have you ever had a severe reaction to latex? no    Vaccine information given and explained to patient? yes    Are you currently pregnant? no    Lot Number:[id number]   Exp Date:08/07/2010   Site Given  Left Deltoid IM    Physical Exam  General:  alert and well-developed.   Head:  normocephalic and atraumatic.   Eyes:  pupils equal and pupils round.   Ears:  R ear normal and L ear normal.   Neck:  enlarged thyroid diffusely Chest Wall:  No deformities, masses, or tenderness noted. Lungs:  Normal respiratory effort, chest expands symmetrically. Lungs are clear to auscultation, no crackles or wheezes. Skin:  turgor normal and color normal.   few chronic linear areas of erythema on upper extremities dry scaley patches on legs----faint erythema   Impression & Recommendations:  Problem # 1:  HYPERTHYROIDISM (ICD-242.90) dou bt rast is related Her updated medication list for this problem includes:    Tapazole 10 Mg Tabs (Methimazole) .[name]... Take  1 tablet by mouth once a day  Problem # 2:  CONTACT DERMATITIS-NOS (ICD-692.9)  improving Her updated medication list for this problem includes:    Claritin 10 Mg Tabs (Loratadine) .[name]... 2 tablets by mouth once a day while has poison ivy, then back to one a day    Benadryl 25 Mg Caps (Diphenhydramine hcl) ..... One by mouth daily    Clobetasol Propionate 0.05 % Soln (Clobetasol propionate) ..... Use as directed  Orders: T-Pan-ANCA (3167)  Complete Medication List: 1)  Claritin 10 Mg Tabs (Loratadine) .... 2 tablets by mouth once a day while has poison ivy, then back to one a day 2)  Glucosamine Sulfate 500 Mg Caps (Glucosamine sulfate) .... Take 2 capsule by mouth once a day 3)  Loestrin 1.5/30 (21) 1.5-30 Mg-mcg Tabs  (Norethindrone acet-ethinyl est) .... Take 1 tablet by mouth once a day 4)  Multivitamins Tabs (Multiple vitamin) .... Once daily 5)  Restasis 0.05 % Emul (Cyclosporine) .... One drop each eye once a day 6)  Tapazole 10 Mg Tabs (Methimazole) .... Take 1 tablet by mouth once a day 7)  Caltrate 600+d 600-400 Mg-unit Tabs (Calcium carbonate-vitamin d) .... Once daily 8)  L-lysine Hcl 500 Mg Tabs (Lysine hcl) .... Once daily 9)  Benadryl 25 Mg Caps (Diphenhydramine hcl) .... One by mouth daily 10)  Fish Oil 500 Mg Caps (Omega-3 fatty acids) .[name].. 1200 mg bid 11)  Aleve 220 Mg Tabs (Naproxen sodium) .... Take 1 tablet by mouth two times a day 12)  Clobetasol Propionate 0.05 % Soln (Clobetasol propionate) .... Use as directed  Other Orders: Admin 1st Vaccine (16109) Flu Vaccine 91yrs + (60454)  Appended Document: Orders Update    Clinical Lists Changes  Orders: Added new Service order of Venipuncture ([id number]) - Signed Added new Service order of Specimen Handling ([id number]) - Signed

## 2010-03-09 NOTE — Progress Notes (Signed)
Summary: cp  Phone Note Call from Patient   Summary of Call: Chest pain & into back.  No radiation, nausea, perspiration.  Going to ER.   Initial call taken by: Rudy Jew, RN,  August 25, 2009 12:13 PM

## 2010-03-11 NOTE — Letter (Signed)
Summary: [facility] [place]   Imported By: Maryln Gottron 02/03/2010 13:23:06  _____________________________________________________________________  External Attachment:    Type:   Image     Comment:   External Document

## 2010-04-12 ENCOUNTER — Other Ambulatory Visit (INDEPENDENT_AMBULATORY_CARE_PROVIDER_SITE_OTHER): Payer: Managed Care, Other (non HMO) | Admitting: Internal Medicine

## 2010-04-12 DIAGNOSIS — E0590 Thyrotoxicosis, unspecified without thyrotoxic crisis or storm: Secondary | ICD-10-CM

## 2010-04-12 LAB — T4, FREE: Free T4: 0.75 ng/dL (ref 0.60–1.60)

## 2010-04-19 ENCOUNTER — Ambulatory Visit: Payer: Self-pay | Admitting: Internal Medicine

## 2010-04-21 ENCOUNTER — Ambulatory Visit (INDEPENDENT_AMBULATORY_CARE_PROVIDER_SITE_OTHER): Payer: Managed Care, Other (non HMO) | Admitting: Internal Medicine

## 2010-04-21 ENCOUNTER — Encounter: Payer: Self-pay | Admitting: Internal Medicine

## 2010-04-21 VITALS — BP 108/74 | Temp 98.6°F | Wt 137.0 lb

## 2010-04-21 DIAGNOSIS — R52 Pain, unspecified: Secondary | ICD-10-CM

## 2010-04-21 NOTE — Patient Instructions (Signed)
Ultrasound in the morning as discussed ( do not eat or drink after midnight tonight) Avoids foods high in acid such as tomatoes citrus juices, and spicy foods.  Avoid eating within two hours of lying down or before exercising.  Do not overheat.  Try smaller more frequent meals.  If symptoms persist, elevate the head of her bed 12 inches while sleeping.  Aciphex/Prilosec one daily Call or return to clinic prn if these symptoms worsen or fail to improve as anticipated.

## 2010-04-22 ENCOUNTER — Other Ambulatory Visit: Payer: Self-pay | Admitting: Internal Medicine

## 2010-04-22 ENCOUNTER — Encounter (INDEPENDENT_AMBULATORY_CARE_PROVIDER_SITE_OTHER): Payer: Self-pay | Admitting: *Deleted

## 2010-04-22 ENCOUNTER — Telehealth: Payer: Self-pay | Admitting: Internal Medicine

## 2010-04-22 ENCOUNTER — Ambulatory Visit
Admission: RE | Admit: 2010-04-22 | Discharge: 2010-04-22 | Disposition: A | Discharge status: Home / Self Care | Payer: Managed Care, Other (non HMO) | Source: Ambulatory Visit | Attending: Internal Medicine | Admitting: Internal Medicine

## 2010-04-22 ENCOUNTER — Encounter: Payer: Self-pay | Admitting: Internal Medicine

## 2010-04-22 DIAGNOSIS — R52 Pain, unspecified: Secondary | ICD-10-CM

## 2010-04-22 NOTE — Progress Notes (Signed)
Quick Note:  Spoke with pt - asked her to call here in AM by 9 if she has not heard from [place] - Gi referal for abd pain. ASAP ,as a work if no avilb appt. kik ______

## 2010-04-22 NOTE — Telephone Encounter (Signed)
Pt called to remind Dr Amador Cunas, to call her today as soon as ultrasound results are available. Pt in extreme amount of pain. Pls call asap.

## 2010-04-22 NOTE — Progress Notes (Signed)
Subjective:    Patient ID: Kristine [name], female    DOB: [birth year], 55 y.o.   MRN: [id number]  HPI   55 year old patient who presents with a chief complaint of abdominal pain this has been present intermittently for some time but has worsened over the past several days. For the past 3 days she has had worsening epigastric tenderness and fairly diffuse abdominal pain this is left and right sided. There is some radiation to the mid back area she is concerned about gallbladder disease. 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 The abdominal and back pain are not associated with nausea and vomiting. There seems to be no relationship to meals or position there has been no fever change in her bowel habits or weight loss. Denies any classic reflux type symptoms    Review of Systems  Constitutional: Negative.   HENT: Negative for hearing loss, congestion, sore throat, rhinorrhea, dental problem, sinus pressure and tinnitus.   Eyes: Negative for pain, discharge and visual disturbance.  Respiratory: Negative for cough and shortness of breath.   Cardiovascular: Negative for chest pain, palpitations and leg swelling.  Gastrointestinal: Negative for nausea, vomiting, abdominal pain, diarrhea, constipation, blood in stool and abdominal distention.  Genitourinary: Negative for dysuria, urgency, frequency, hematuria, flank pain, vaginal bleeding, vaginal discharge, difficulty urinating, vaginal pain and pelvic pain.  Musculoskeletal: Positive for back pain. Negative for joint swelling, arthralgias and gait problem.  Skin: Negative for rash.  Neurological: Negative for dizziness, syncope, speech difficulty, weakness, numbness and headaches.  Hematological: Negative for adenopathy.  Psychiatric/Behavioral: Negative for behavioral problems, dysphoric mood and agitation. The patient is not nervous/anxious.        Objective:   Physical Exam  Constitutional: She is oriented to person, place, and time. She appears well-developed and well-nourished.  HENT:  Head: Normocephalic.  Right Ear: External ear normal.  Left Ear: External ear normal.  Mouth/Throat: Oropharynx is clear and moist.  Eyes: Conjunctivae and EOM are normal. Pupils are equal, round, and reactive to light.  Neck: Normal range of motion. Neck supple. No thyromegaly present.  Cardiovascular: Normal rate, regular rhythm, normal heart sounds and intact distal pulses.   Pulmonary/Chest: Effort normal and breath sounds normal.  Abdominal: Soft. Bowel sounds are normal. She exhibits no mass. There is tenderness.        The patient had very mild fairly diffuse tenderness probably more marked on the left midabdominal area there is no right upper quadrant tenderness. She did have some epigastric tenderness the abdominal aortic pulsation appeared prominent with a bruit. Pedal pulses were intact  Musculoskeletal: Normal range of motion.  Lymphadenopathy:    She has no cervical adenopathy.  Neurological: She is alert and oriented to person, place, and time.  Skin: Skin is warm and dry. No rash noted.  Psychiatric: She has a normal mood and affect. Her behavior is normal.          Assessment & Plan:   abdominal and back pain. Unclear etiology symptoms are certainly not very suggestive of gallbladder disease but the patient is very much concerned. We'll check an abdominal ultrasound to assess size of the abdominal aorta and to check for pathology and the gallbladder etc. Might very well be related to adhesions from her prior surgery. The results of abdominal ultrasound may require further additional testing. We'll place on anti-reflex  Regimen  and short-term proton pump inhibition

## 2010-04-23 ENCOUNTER — Telehealth: Payer: Self-pay | Admitting: Gastroenterology

## 2010-04-23 ENCOUNTER — Other Ambulatory Visit: Payer: Self-pay | Admitting: Physician Assistant

## 2010-04-23 ENCOUNTER — Encounter: Payer: Self-pay | Admitting: Physician Assistant

## 2010-04-23 ENCOUNTER — Other Ambulatory Visit: Payer: Managed Care, Other (non HMO)

## 2010-04-23 ENCOUNTER — Ambulatory Visit (INDEPENDENT_AMBULATORY_CARE_PROVIDER_SITE_OTHER): Payer: Managed Care, Other (non HMO) | Admitting: Physician Assistant

## 2010-04-23 DIAGNOSIS — E0590 Thyrotoxicosis, unspecified without thyrotoxic crisis or storm: Secondary | ICD-10-CM | POA: Insufficient documentation

## 2010-04-23 DIAGNOSIS — R1013 Epigastric pain: Secondary | ICD-10-CM

## 2010-04-23 DIAGNOSIS — R079 Chest pain, unspecified: Secondary | ICD-10-CM

## 2010-04-23 DIAGNOSIS — Z860100 Personal history of colon polyps, unspecified: Secondary | ICD-10-CM | POA: Insufficient documentation

## 2010-04-23 LAB — CBC WITH DIFFERENTIAL/PLATELET
Eosinophils Absolute: 0.1 10*3/uL (ref 0.0–0.7)
Eosinophils Relative: 0.6 % (ref 0.0–5.0)
HCT: 41.2 % (ref 36.0–46.0)
Lymphs Abs: 1.9 10*3/uL (ref 0.7–4.0)
MCHC: 35.4 g/dL (ref 30.0–36.0)
MCV: 96.4 fl (ref 78.0–100.0)
Monocytes Absolute: 1 10*3/uL (ref 0.1–1.0)
Platelets: 272 10*3/uL (ref 150.0–400.0)
WBC: 12.1 10*3/uL — ABNORMAL HIGH (ref 4.5–10.5)

## 2010-04-23 LAB — HIGH SENSITIVITY CRP: CRP, High Sensitivity: 112.65 mg/L — ABNORMAL HIGH (ref 0.00–5.00)

## 2010-04-24 LAB — CBC
HCT: 41.9 % (ref 36.0–46.0)
Hemoglobin: 14.5 g/dL (ref 12.0–15.0)
MCH: 33.9 pg (ref 26.0–34.0)
MCHC: 34.7 g/dL (ref 30.0–36.0)

## 2010-04-24 LAB — POCT I-STAT, CHEM 8
Calcium, Ion: 1.1 mmol/L — ABNORMAL LOW (ref 1.12–1.32)
Glucose, Bld: 86 mg/dL (ref 70–99)
HCT: 43 % (ref 36.0–46.0)
Hemoglobin: 14.6 g/dL (ref 12.0–15.0)
TCO2: 23 mmol/L (ref 0–100)

## 2010-04-24 LAB — POCT CARDIAC MARKERS
CKMB, poc: <1 ng/mL — ABNORMAL LOW (ref 1.0–8.0)
Troponin i, poc: <0.05 ng/mL (ref 0.00–0.09)

## 2010-04-24 LAB — DIFFERENTIAL
Basophils Relative: 1 % (ref 0–1)
Eosinophils Absolute: 0 10*3/uL (ref 0.0–0.7)
Monocytes Absolute: 0.5 10*3/uL (ref 0.1–1.0)
Monocytes Relative: 6 % (ref 3–12)

## 2010-04-27 NOTE — Progress Notes (Signed)
Summary: Triage  Phone Note From Other Clinic   Caller: Aurther Loft @ [facility] [phone] Call For: Dr. Christella Hartigan Summary of Call: Severe abd pain...normal Ultrasound yesterday....requesting pt. be seen today Initial call taken by: Karna Christmas,  April 23, 2010 8:05 AM  Follow-up for Phone Call        left message on machine to call back Chales Abrahams CMA (AAMA)  April 23, 2010 8:24 AM   Severe epigastric pain had noraml [place] on yesterday.  Dr Amador Cunas would like pt seen today for possible gallbladder disease.  Pt to be seen today at 130 with Amy per Dr Christella Hartigan,  Aurther Loft will notify pt Follow-up by: Chales Abrahams CMA Duncan Dull),  April 23, 2010 9:37 AM

## 2010-04-29 ENCOUNTER — Ambulatory Visit (INDEPENDENT_AMBULATORY_CARE_PROVIDER_SITE_OTHER): Payer: Managed Care, Other (non HMO) | Admitting: Internal Medicine

## 2010-04-29 VITALS — BP 102/74 | HR 88 | Temp 98.1°F | Wt 134.0 lb

## 2010-04-29 DIAGNOSIS — E0500 Thyrotoxicosis with diffuse goiter without thyrotoxic crisis or storm: Secondary | ICD-10-CM

## 2010-04-29 MED ORDER — METHIMAZOLE 10 MG PO TABS: 5.0000 mg | ORAL_TABLET | Freq: Every day | ORAL | Status: DC

## 2010-04-29 NOTE — Progress Notes (Signed)
  Subjective:    Patient ID: Kristine [name], female    DOB: [birth year], 55 y.o.   MRN: [id number]  HPI Graves---feeling well  Had an episode of CP/epigastric pain---reviewed ultrasound, notes from [facility] and GI. Note crp>100. She is now feeling well. No complaints  Past Medical History  Diagnosis Date  . GRAVE'S DISEASE 01/24/2007   No past surgical history on file.  reports that she has never smoked. She does not have any smokeless tobacco history on file. Her alcohol and drug histories not on file. family history is not on file. No Known Allergies    Review of Systems  patient denies chest pain, shortness of breath, orthopnea. Denies lower extremity edema, abdominal pain, change in appetite, change in bowel movements. Patient denies rashes, musculoskeletal complaints. No other specific complaints in a complete review of systems.      Objective:   Physical Exam  well-developed well-nourished female in no acute distress. HEENT exam atraumatic, normocephalic, neck supple without jugular venous distention. Chest clear to auscultation cardiac exam S1-S2 are regular. Abdominal exam overweight with bowel sounds, soft and nontender. Extremities no edema. Neurologic exam is alert with a normal gait.      Assessment & Plan:

## 2010-04-29 NOTE — Assessment & Plan Note (Signed)
Discussed Labs are good Decrease tapazole

## 2010-05-06 NOTE — Assessment & Plan Note (Signed)
Summary: severe epigastric pain, normal [place] /pl       History of Present Illness Visit Type: Follow-up Visit Primary GI MD: Rob Bunting MD Primary Provider: Birdie Sons, MD  Requesting Provider: Eleonore Chiquito, MD Chief Complaint: Generalized abd pain severe this weekend but starting to feel better today  History of Present Illness:    This is a pleasant 55 year old white female known to Dr. Christella Hartigan from previous colonoscopy done in August of 2010 for screening. She did have a tubular adenomatous polyp and an otherwise normal exam.   Patient comes in today as an urgent add-on with acute epigastric, chest and back pain onset on Sunday 3/ 11. She says she felt horrible for 4 days and is feeling much better today. She was seen by [facility] ,was  given samples of AcipHex which she did take for a couple of days and has now stopped. She also underwent upper abdominal ultrasound on 3/:15/ 2012 and this was normal with no evidence of gallstones and a normal sized common bile duct. She describes her pain as a constant burning sensation which was worse with deep breath or movement. She did not have any associated fever chills sweats no cough no shortness of breath no nausea or vomiting no diarrhea headache etc. She describes what felt like a "severe muscle tightness" all over. She says she experience something similar last summer and actually went to the emergency room because she was concerned about her heart this episode resolved within a couple of days. Her only other symptom is fatigue which she feels is due to lack of sleep because she was so uncomfortable over the past few days. She generally is quite active does yoga  on a regular basis and is not aware of any recent activity that would have precipitated an injury, strain etc.   GI Review of Systems    Reports abdominal pain.     Location of  Abdominal pain: generalized.    Denies acid reflux, belching, bloating, chest pain, dysphagia with  liquids, dysphagia with solids, heartburn, loss of appetite, nausea, vomiting, vomiting blood, weight loss, and  weight gain.        Denies anal fissure, black tarry stools, change in bowel habit, constipation, diarrhea, diverticulosis, fecal incontinence, heme positive stool, hemorrhoids, irritable bowel syndrome, jaundice, light color stool, liver problems, rectal bleeding, and  rectal pain.    Current Medications (verified): 1)  Claritin 10 Mg Tabs (Loratadine) .... Take One By Mouth Daily 2)  Glucosamine Sulfate 500 Mg Caps (Glucosamine Sulfate) .... Take 2 Capsule By Mouth Once A Day 3)  Loestrin 1.5/30 (21) 1.5-30 Mg-Mcg Tabs (Norethindrone Acet-Ethinyl Est) .... Take 1 Tablet By Mouth Once A Day 4)  Multivitamins   Tabs (Multiple Vitamin) .... Once Daily 5)  Tapazole 10 Mg  Tabs (Methimazole) .... Take 1 Tablet By Mouth Once A Day 6)  Benadryl 25 Mg Caps (Diphenhydramine Hcl) .... One By Mouth Daily 7)  Fish Oil 500 Mg Caps (Omega-3 Fatty Acids) .[name].. 1200 Mg Bid 8)  Clobetasol Propionate 0.05 % Soln (Clobetasol Propionate) .... 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 Married Never Smoked Regular exercise-yes  Review of Systems       The patient complains of back pain and  shortness of breath.  The patient denies allergy/sinus, anemia, anxiety-new, arthritis/joint pain, blood in urine, breast changes/lumps, change in vision, confusion, cough, coughing up blood, depression-new, fainting, fatigue, fever, headaches-new, hearing problems, heart murmur, heart rhythm changes, itching, menstrual pain, muscle pains/cramps, night sweats, nosebleeds, pregnancy  symptoms, skin rash, sleeping problems, sore throat, swelling of feet/legs, swollen lymph glands, thirst - excessive , urination - excessive , urination changes/pain, urine leakage, vision changes, and voice change.         see hpi  Vital Signs:  Patient profile:   55 year old female Height:      64.5 inches Weight:      136 pounds BMI:     23.07 BSA:     1.67 Pulse rate:   76 / minute Pulse rhythm:   regular BP sitting:   120 / 64  (left arm) Cuff size:   regular  Vitals Entered By: Ok Anis CMA (April 23, 2010 1:52 PM)  Physical Exam  General:  Well developed, well nourished, no acute distress. Head:  Normocephalic and atraumatic. Eyes:  PERRLA, no icterus. Neck:  Supple; no masses or thyromegaly. Chest Wall:  quite tender over sternum, and left anterior  lower ribs to palpation. Lungs:  Clear throughout to auscultation. Heart:  Regular rate and rhythm; no murmurs, rubs,  or bruits. Abdomen:  soft, mild tenderness left costal margin, no guarding, no mass or hsm,bs+ Rectal:  not done Extremities:  No clubbing, cyanosis, edema or deformities noted. Neurologic:  Alert and  oriented x4;  grossly normal neurologically. Psych:  Alert and cooperative. Normal mood and affect.   Impression & Recommendations:  Problem # 1:  ABDOMINAL PAIN, EPIGASTRIC (ICD-789.06) Assessment New  55 year old white female with 5 day history of acute epigastric and chest wall pain very consistent with costochondritis. She is at least 80% better at this time.  I do not believe any further GI workup is indicated at this time    advised when necessary Aleve or Advil   Norflex 100 mg by mouth twice daily as needed any event she has a recurrent episode    check CBC sedimentation rate and CRP today   patient was advised to call should she have any recurrence of her symptoms TLB-CBC Platelet - w/Differential (85025-CBCD) TLB-CRP-High Sensitivity (C-Reactive Protein) (86140-FCRP) TLB-Sedimentation  Rate (ESR) (85652-ESR)  Problem # 2:  HYPERTHYROIDISM (ICD-242.9) Assessment: Comment Only  Problem # 3:  PERSONAL HX COLONIC POLYPS (ICD-V12.72) Assessment: Comment Only  Due for followup colonoscopy 2015  Patient Instructions: 1)  Please go to lab, basement level.[name] 2)  We faxed  prescription for Norflex to [address]  3)  Copy sent to : Dr Birdie Sons 4)                         Dr. Eleonore Chiquito 5)  The medication list was reviewed and reconciled.  All changed / newly prescribed medications were explained.  A complete medication list was provided to the patient / caregiver. Prescriptions: NORFLEX 100 MG Take 1 tab twice daily as needed for muscle pain and spasms  #20 x 0   Entered by:   Lowry Ram NCMA   Authorized by:   Sammuel Cooper PA-c   Signed by:   Lowry Ram NCMA on 04/23/2010   Method used:   Faxed to ...       [facility]  [address] [phone]* (retail)  [address]       [place], [place]  [address]       Ph: [phone] or [phone]       Fax: [phone]   RxID:   [id number]

## 2010-06-28 ENCOUNTER — Other Ambulatory Visit (INDEPENDENT_AMBULATORY_CARE_PROVIDER_SITE_OTHER): Payer: Managed Care, Other (non HMO) | Admitting: Internal Medicine

## 2010-06-28 DIAGNOSIS — E0500 Thyrotoxicosis with diffuse goiter without thyrotoxic crisis or storm: Secondary | ICD-10-CM

## 2010-06-29 LAB — HIGH SENSITIVITY CRP: CRP, High Sensitivity: 5.08 mg/L — ABNORMAL HIGH (ref 0.00–5.00)

## 2010-06-29 LAB — TSH: TSH: 0.02 u[IU]/mL — ABNORMAL LOW (ref 0.35–5.50)

## 2010-06-29 LAB — T4, FREE: Free T4: 1.7 ng/dL — ABNORMAL HIGH (ref 0.60–1.60)

## 2010-07-16 ENCOUNTER — Ambulatory Visit (HOSPITAL_BASED_OUTPATIENT_CLINIC_OR_DEPARTMENT_OTHER)
Admission: RE | Admit: 2010-07-16 | Discharge: 2010-07-16 | Disposition: A | Discharge status: Home / Self Care | Payer: Managed Care, Other (non HMO) | Source: Ambulatory Visit | Attending: Orthopedic Surgery | Admitting: Orthopedic Surgery

## 2010-07-16 DIAGNOSIS — G5600 Carpal tunnel syndrome, unspecified upper limb: Secondary | ICD-10-CM | POA: Insufficient documentation

## 2010-07-16 DIAGNOSIS — Z01812 Encounter for preprocedural laboratory examination: Secondary | ICD-10-CM | POA: Insufficient documentation

## 2010-07-20 NOTE — Op Note (Signed)
  [name], [name] NO.:  [id number]  MEDICAL RECORD NO.:  [id number]  LOCATION:                                 FACILITY:  PHYSICIAN:  Katy Fitch. Silver Achey, [name]      DATE OF BIRTH:  DATE OF PROCEDURE:  07/16/2010 DATE OF DISCHARGE:                              OPERATIVE REPORT   PREOPERATIVE DIAGNOSIS:  Chronic entrapped neuropathy, median nerve, right carpal tunnel.  POSTOPERATIVE DIAGNOSIS:  Chronic entrapped neuropathy, median nerve, right carpal tunnel.  OPERATION:  Release of right transverse carpal ligament.  OPERATING SURGEON:  Katy Fitch. Aundraya Dripps, MD  ASSISTANT:  Marveen Reeks Dasnoit, PA  ANESTHESIA:  General by LMA.  SUPERVISING ANESTHESIOLOGIST:  Janetta Hora. Gelene Mink, MD  INDICATIONS:  [name] is a 55 year old woman referred through the courtesy of Dr. Birdie Sons for evaluation and management of hand numbness.  She has a history of chronic numbness in the median innervated fingers of the right hand.  She has not responded to splinting, activity modification, anti-inflammatory medication, or steroid injection.  Electrodiagnostic studies in March documented moderately severe carpal tunnel syndrome.  We advised her to consider release of her right transverse carpal ligament.  She returns and requested we proceed at this time.  PROCEDURE:  [name] was brought to room one of the [facility] and placed in supine position on the operating table.  Following the induction of general anesthesia by LMA technique, right arm was prepped with Betadine soap and solution and sterilely draped.  A pneumatic tourniquet was applied to the proximal right brachium. Following exsanguination of right arm with Esmarch bandage, the arterial tourniquet was inflated to 220 mmHg.  A routine surgical time-out was accomplished.  [name], the ligament was released with scissors extending into the distal forearm.  This widely opened the carpal canal.  No mass or other predicaments were noted.  Bleeding points along the margin of the released ligament were electrocauterized with bipolar current.  The wound was then repaired with intradermal 3-0 Prolene suture.  For aftercare, she was placed in a volar plaster splint maintaining the wrist in 10 degrees of dorsiflexion.  She was provided prescription for Percocet 5 mg one p.o. q.4-6 h. p.r.n. pain, 20 tablets without refill.  We will see her back for follow up in 1 week for dressing change, suture removal, and advancement to a postoperative rehab program.     [name], [name]     RVS/MEDQ  D:  07/16/2010  T:  07/16/2010  Job:  [id number]  cc:   Valetta Mole. Swords, MD  Electronically Signed by Josephine Igo [name] on 07/20/2010 81:19:14 PM

## 2010-09-16 ENCOUNTER — Other Ambulatory Visit (INDEPENDENT_AMBULATORY_CARE_PROVIDER_SITE_OTHER): Payer: Managed Care, Other (non HMO)

## 2010-09-16 DIAGNOSIS — E349 Endocrine disorder, unspecified: Secondary | ICD-10-CM

## 2010-09-16 LAB — TSH: TSH: 0.02 u[IU]/mL — ABNORMAL LOW (ref 0.35–5.50)

## 2010-09-16 LAB — T4, FREE: Free T4: 0.93 ng/dL (ref 0.60–1.60)

## 2010-09-16 NOTE — Progress Notes (Signed)
Addended by: Bonnye Fava on: 09/16/2010 02:31 PM   Modules accepted: Orders

## 2010-11-26 ENCOUNTER — Other Ambulatory Visit: Payer: Self-pay | Admitting: Internal Medicine

## 2010-11-29 ENCOUNTER — Other Ambulatory Visit (INDEPENDENT_AMBULATORY_CARE_PROVIDER_SITE_OTHER): Payer: Managed Care, Other (non HMO)

## 2010-11-29 DIAGNOSIS — E039 Hypothyroidism, unspecified: Secondary | ICD-10-CM

## 2010-11-29 LAB — TSH: TSH: 0.28 u[IU]/mL — ABNORMAL LOW (ref 0.35–5.50)

## 2010-11-29 LAB — T4, FREE: Free T4: 0.68 ng/dL (ref 0.60–1.60)

## 2011-02-21 ENCOUNTER — Telehealth: Payer: Self-pay | Admitting: Internal Medicine

## 2011-02-21 MED ORDER — METHIMAZOLE 10 MG PO TABS: ORAL_TABLET | ORAL | Status: DC

## 2011-02-21 NOTE — Telephone Encounter (Signed)
rx sent in electronically 

## 2011-02-21 NOTE — Telephone Encounter (Signed)
Pt no longer uses [facility], so pt needs a new script written for methimazole (TAPAZOLE) 10 MG tablet #90, pt is taking 15 mg 3days a wk and 10 mg 4 days, per Dr Cato Mulligan. with refills called in to [facility] [address].      Pt is completely out of meds. Pls call pt when this has been done.

## 2011-03-10 ENCOUNTER — Other Ambulatory Visit (INDEPENDENT_AMBULATORY_CARE_PROVIDER_SITE_OTHER): Payer: Managed Care, Other (non HMO)

## 2011-03-10 DIAGNOSIS — E039 Hypothyroidism, unspecified: Secondary | ICD-10-CM

## 2011-03-10 LAB — T3, FREE: T3, Free: 2.6 pg/mL (ref 2.3–4.2)

## 2011-09-23 ENCOUNTER — Telehealth: Payer: Self-pay | Admitting: Internal Medicine

## 2011-09-23 NOTE — Telephone Encounter (Signed)
Pt requesting to have the following labs Free t 3, Free T 4, tsh, Glocuse and Lipid profile please advise

## 2011-09-23 NOTE — Telephone Encounter (Signed)
Ok to schedule labs but pt has not been seen since 04/2010 so she needs to see Dr Cato Mulligan also

## 2011-09-26 NOTE — Telephone Encounter (Signed)
lmom for pt to call and schedule appt

## 2011-09-27 NOTE — Telephone Encounter (Signed)
lmom for pt to call and schedule

## 2011-09-29 ENCOUNTER — Other Ambulatory Visit: Payer: Self-pay | Admitting: Internal Medicine

## 2011-11-08 ENCOUNTER — Other Ambulatory Visit (INDEPENDENT_AMBULATORY_CARE_PROVIDER_SITE_OTHER): Payer: Managed Care, Other (non HMO)

## 2011-11-08 DIAGNOSIS — E0590 Thyrotoxicosis, unspecified without thyrotoxic crisis or storm: Secondary | ICD-10-CM

## 2011-11-08 LAB — BASIC METABOLIC PANEL WITH GFR
BUN: 13 mg/dL (ref 6–23)
CO2: 27 meq/L (ref 19–32)
Calcium: 9 mg/dL (ref 8.4–10.5)
Chloride: 107 meq/L (ref 96–112)
Creatinine, Ser: 0.8 mg/dL (ref 0.4–1.2)
GFR: 85.64 mL/min
Glucose, Bld: 80 mg/dL (ref 70–99)
Potassium: 3.9 meq/L (ref 3.5–5.1)
Sodium: 139 meq/L (ref 135–145)

## 2011-11-08 LAB — T4, FREE: Free T4: 0.46 ng/dL — ABNORMAL LOW (ref 0.60–1.60)

## 2011-11-08 LAB — LIPID PANEL
Cholesterol: 183 mg/dL (ref 0–200)
HDL: 63.4 mg/dL
LDL Cholesterol: 113 mg/dL — ABNORMAL HIGH (ref 0–99)
Total CHOL/HDL Ratio: 3
Triglycerides: 35 mg/dL (ref 0.0–149.0)
VLDL: 7 mg/dL (ref 0.0–40.0)

## 2011-11-08 LAB — TSH: TSH: 13.63 u[IU]/mL — ABNORMAL HIGH (ref 0.35–5.50)

## 2011-11-16 ENCOUNTER — Ambulatory Visit: Payer: Managed Care, Other (non HMO) | Admitting: Internal Medicine

## 2011-11-18 ENCOUNTER — Ambulatory Visit (INDEPENDENT_AMBULATORY_CARE_PROVIDER_SITE_OTHER): Payer: Managed Care, Other (non HMO) | Admitting: Internal Medicine

## 2011-11-18 ENCOUNTER — Encounter: Payer: Self-pay | Admitting: Internal Medicine

## 2011-11-18 VITALS — BP 92/70 | HR 64 | Temp 98.1°F | Wt 134.0 lb

## 2011-11-18 DIAGNOSIS — E0500 Thyrotoxicosis with diffuse goiter without thyrotoxic crisis or storm: Secondary | ICD-10-CM

## 2011-11-18 NOTE — Progress Notes (Signed)
Patient ID: Kristine [name], female   DOB: [birth year], 56 y.o.   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 GRAVE'S DISEASE 01/24/2007    History   Social History  . Marital Status: Married    Spouse Name: N/A    Number of Children: N/A  . Years of Education: N/A   Occupational History  . Not on file.   Social History Main Topics  . Smoking status: Never Smoker   . Smokeless tobacco: Not on file  . Alcohol Use: Not on file  . Drug Use: Not on file  . Sexually Active: Not on file   Other Topics Concern  . Not on file   Social History Narrative  . No narrative on file    No past surgical history on file.  No family history on file.  No Known Allergies  Current Outpatient Prescriptions on File Prior to Visit  Medication Sig Dispense Refill  . clobetasol (TEMOVATE) 0.05 % external solution Apply 1 application topically as needed.       . diphenhydrAMINE (BENADRYL) 25 MG tablet Take 25 mg by mouth daily.        . Glucosamine Sulfate 500 MG CAPS Take 2 capsules by mouth daily.        [name] levocetirizine (XYZAL) 5 MG tablet Take 5 mg by mouth every evening.      . methimazole (TAPAZOLE) 10 MG tablet TAKE 1&1/2 TABLETS 3 DAYS A WEEK, THEN TAKE 1 TABLET 4 DAYS A WEEK  90 tablet  1  . Multiple Vitamin (MULTIVITAMIN) tablet Take 1 tablet by mouth daily.        . Omega-3 Fatty Acids (FISH OIL) 500 MG CAPS Take by mouth. 1200 mg daily           patient denies chest pain, shortness of breath, orthopnea. Denies lower extremity edema, abdominal pain, change in appetite, change in bowel movements. Patient denies rashes, musculoskeletal complaints. No other specific complaints in a complete review of systems.   BP 92/70  Pulse 64  Temp 98.1 F (36.7 C) (Oral)  Wt 134 lb (60.782 kg)  well-developed well-nourished female in no acute distress. HEENT exam atraumatic, normocephalic, neck supple without jugular  venous distention. Chest clear to auscultation cardiac exam S1-S2 are regular.

## 2011-11-18 NOTE — Assessment & Plan Note (Signed)
tsh elevated Tapazole- decreased recheck in 6 weeks

## 2011-12-26 ENCOUNTER — Encounter: Payer: Self-pay | Admitting: Internal Medicine

## 2011-12-26 ENCOUNTER — Other Ambulatory Visit (INDEPENDENT_AMBULATORY_CARE_PROVIDER_SITE_OTHER): Payer: Managed Care, Other (non HMO)

## 2011-12-26 DIAGNOSIS — E0500 Thyrotoxicosis with diffuse goiter without thyrotoxic crisis or storm: Secondary | ICD-10-CM

## 2011-12-29 ENCOUNTER — Telehealth: Payer: Self-pay | Admitting: Internal Medicine

## 2011-12-29 NOTE — Progress Notes (Signed)
go back to taking 10 mg M,W,F and 5 mg all others-- please update med list  Check tsh , free T4 and free T3 in 3 months

## 2011-12-29 NOTE — Telephone Encounter (Signed)
Pt called and said that she is returning call from nurse. Pls call

## 2011-12-29 NOTE — Telephone Encounter (Signed)
See result note.  

## 2012-01-11 ENCOUNTER — Encounter: Payer: Self-pay | Admitting: Internal Medicine

## 2012-01-13 ENCOUNTER — Encounter: Payer: Self-pay | Admitting: Family

## 2012-01-13 ENCOUNTER — Ambulatory Visit (INDEPENDENT_AMBULATORY_CARE_PROVIDER_SITE_OTHER): Payer: Managed Care, Other (non HMO) | Admitting: Family

## 2012-01-13 VITALS — BP 104/68 | HR 70 | Temp 98.0°F | Wt 133.0 lb

## 2012-01-13 DIAGNOSIS — R05 Cough: Secondary | ICD-10-CM

## 2012-01-13 DIAGNOSIS — J209 Acute bronchitis, unspecified: Secondary | ICD-10-CM

## 2012-01-13 DIAGNOSIS — Z1231 Encounter for screening mammogram for malignant neoplasm of breast: Secondary | ICD-10-CM

## 2012-01-13 MED ORDER — AZITHROMYCIN 250 MG PO TABS: 250.0000 mg | ORAL_TABLET | Freq: Every day | ORAL | Status: DC

## 2012-01-13 MED ORDER — METHYLPREDNISOLONE ACETATE 80 MG/ML IJ SUSP: 40.0000 mg | Freq: Once | INTRAMUSCULAR | Status: DC

## 2012-01-13 MED ORDER — ALBUTEROL SULFATE HFA 108 (90 BASE) MCG/ACT IN AERS: 2.0000 | INHALATION_SPRAY | Freq: Four times a day (QID) | RESPIRATORY_TRACT | Status: DC | PRN

## 2012-01-13 MED ORDER — METHYLPREDNISOLONE ACETATE 40 MG/ML IJ SUSP
40.0000 mg | Freq: Once | INTRAMUSCULAR | Status: AC
  Administered 2012-01-13: 40 mg via INTRAMUSCULAR

## 2012-01-13 NOTE — Progress Notes (Signed)
  Subjective:    Patient ID: Kristine [name], female    DOB: 09/17/1955, 57 y.o.   MRN: [id number]  Cough This is a new problem. The current episode started in the past 7 days. The cough is productive of brown sputum. Associated symptoms include wheezing. Pertinent negatives include no chest pain, fever or shortness of breath. She has tried OTC cough suppressant (Mucinex) for the symptoms. The treatment provided no relief.      Review of Systems  Constitutional: Negative.  Negative for fever.  HENT: Positive for congestion and voice change.   Respiratory: Positive for cough and wheezing. Negative for shortness of breath.   Cardiovascular: Negative.  Negative for chest pain, palpitations and leg swelling.  Musculoskeletal: Negative.   Skin: Negative.   Neurological: Negative.   Hematological: Negative.   Psychiatric/Behavioral: Negative.        Objective:   Physical Exam  Constitutional: She is oriented to person, place, and time. She appears well-developed and well-nourished.  HENT:  Right Ear: External ear normal.  Left Ear: External ear normal.  Nose: Nose normal.  Mouth/Throat: Oropharynx is clear and moist.  Neck: Normal range of motion. Neck supple. No thyromegaly present.  Cardiovascular: Normal rate, regular rhythm and normal heart sounds.   Pulmonary/Chest: Effort normal. She has wheezes.       Wheezing bilaterally but good air movement.  Musculoskeletal: Normal range of motion.  Neurological: She is alert and oriented to person, place, and time.  Skin: Skin is dry.  Psychiatric: She has a normal mood and affect.      Depo-Medrol 40mg  IM x 1    Assessment & Plan:  Assessment: Bronchitis, Cough  Plan: Albuterol 2 puffs every 4-6 hours as needed. zpak as directed. Call the office if symptoms worsen or persist. Recheck as scheduled and sooner as needed.

## 2012-01-13 NOTE — Patient Instructions (Addendum)

## 2012-03-24 ENCOUNTER — Other Ambulatory Visit: Payer: Self-pay

## 2012-05-01 ENCOUNTER — Other Ambulatory Visit: Payer: Self-pay | Admitting: *Deleted

## 2012-05-01 MED ORDER — METHIMAZOLE 10 MG PO TABS: ORAL_TABLET | ORAL | Status: DC

## 2012-07-16 ENCOUNTER — Encounter: Payer: Self-pay | Admitting: Internal Medicine

## 2012-10-18 ENCOUNTER — Other Ambulatory Visit: Payer: Self-pay | Admitting: *Deleted

## 2012-10-18 MED ORDER — METHIMAZOLE 10 MG PO TABS: ORAL_TABLET | ORAL | Status: DC

## 2012-10-25 ENCOUNTER — Telehealth: Payer: Self-pay | Admitting: Internal Medicine

## 2012-10-25 NOTE — Telephone Encounter (Signed)
Pt called and scheduled a fu appt for November. She would like to come in a week in advance for all thyroid labs, she then requested to have her cholesterol, lipids, ect checked. I explained to her that those would be her physical labs, but that I'd request them. Please advise.

## 2012-10-26 NOTE — Telephone Encounter (Signed)
S/w pt and she will come fasting / ga

## 2012-10-26 NOTE — Telephone Encounter (Signed)
ok 

## 2012-10-26 NOTE — Telephone Encounter (Signed)
You can schedule these labs q week prior

## 2012-12-13 ENCOUNTER — Other Ambulatory Visit: Payer: Self-pay

## 2013-01-02 ENCOUNTER — Ambulatory Visit: Payer: Managed Care, Other (non HMO) | Admitting: Internal Medicine

## 2013-01-30 ENCOUNTER — Ambulatory Visit (INDEPENDENT_AMBULATORY_CARE_PROVIDER_SITE_OTHER): Payer: Managed Care, Other (non HMO) | Admitting: Internal Medicine

## 2013-01-30 ENCOUNTER — Encounter: Payer: Self-pay | Admitting: Internal Medicine

## 2013-01-30 VITALS — BP 112/64 | HR 68 | Temp 98.3°F | Ht 64.5 in | Wt 123.0 lb

## 2013-01-30 DIAGNOSIS — E0500 Thyrotoxicosis with diffuse goiter without thyrotoxic crisis or storm: Secondary | ICD-10-CM

## 2013-01-30 DIAGNOSIS — Z23 Encounter for immunization: Secondary | ICD-10-CM

## 2013-01-30 LAB — T3, FREE: T3, Free: 3.1 pg/mL (ref 2.3–4.2)

## 2013-01-30 MED ORDER — METHIMAZOLE 10 MG PO TABS: ORAL_TABLET | ORAL | Status: DC

## 2013-01-30 NOTE — Progress Notes (Signed)
Graves treated with tapazole. She feels well and has no complaints  Past Medical History  Diagnosis Date  . GRAVE'S DISEASE 01/24/2007    History   Social History  . Marital Status: Married    Spouse Name: N/A    Number of Children: N/A  . Years of Education: N/A   Occupational History  . Not on file.   Social History Main Topics  . Smoking status: Never Smoker   . Smokeless tobacco: Not on file  . Alcohol Use: Not on file  . Drug Use: Not on file  . Sexual Activity: Not on file   Other Topics Concern  . Not on file   Social History Narrative  . No narrative on file    No past surgical history on file.  No family history on file.  No Known Allergies  Current Outpatient Prescriptions on File Prior to Visit  Medication Sig Dispense Refill  . albuterol (PROVENTIL HFA;VENTOLIN HFA) 108 (90 BASE) MCG/ACT inhaler Inhale 2 puffs into the lungs every 6 (six) hours as needed for wheezing.  1 Inhaler  0  . clobetasol (TEMOVATE) 0.05 % external solution Apply 1 application topically as needed.       . diphenhydrAMINE (BENADRYL) 25 MG tablet Take 25 mg by mouth daily.        . Glucosamine Sulfate 500 MG CAPS Take 2 capsules by mouth daily.        . Multiple Vitamin (MULTIVITAMIN) tablet Take 1 tablet by mouth daily.        . Omega-3 Fatty Acids (FISH OIL) 500 MG CAPS Take by mouth. 1200 mg daily         No current facility-administered medications on file prior to visit.     patient denies chest pain, shortness of breath, orthopnea. Denies lower extremity edema, abdominal pain, change in appetite, change in bowel movements. Patient denies rashes, musculoskeletal complaints. No other specific complaints in a complete review of systems.   BP 112/64  Pulse 68  Temp(Src) 98.3 F (36.8 C) (Oral)  Ht 5' 4.5" (1.638 m)  Wt 123 lb (55.792 kg)  BMI 20.79 kg/m2  Thyroid enlarged- nontender Cv: reg rate abd- soft No tremor  GRAVE'S DISEASE Continue tapazole Check thyroid  labs today

## 2013-01-30 NOTE — Progress Notes (Signed)
Pre visit review using our clinic review tool, if applicable. No additional management support is needed unless otherwise documented below in the visit note. 

## 2013-02-02 NOTE — Assessment & Plan Note (Signed)
Continue tapazole Check thyroid labs today

## 2013-04-24 ENCOUNTER — Ambulatory Visit (INDEPENDENT_AMBULATORY_CARE_PROVIDER_SITE_OTHER): Payer: Managed Care, Other (non HMO) | Admitting: Internal Medicine

## 2013-04-24 ENCOUNTER — Encounter: Payer: Self-pay | Admitting: Internal Medicine

## 2013-04-24 VITALS — BP 128/72 | HR 104 | Temp 98.5°F | Ht 64.5 in | Wt 125.0 lb

## 2013-04-24 DIAGNOSIS — J111 Influenza due to unidentified influenza virus with other respiratory manifestations: Secondary | ICD-10-CM

## 2013-04-24 MED ORDER — OSELTAMIVIR PHOSPHATE 75 MG PO CAPS: 75.0000 mg | ORAL_CAPSULE | Freq: Two times a day (BID) | ORAL | Status: DC

## 2013-04-24 NOTE — Progress Notes (Signed)
Chief Complaint  Patient presents with  . Fever    Started on Monday.  . Cough  . Generalized Body Aches  . Fatigue    HPI: Patient comes in today for SDA for  new problem evaluation. 2 days ago began coughing minimally and yestserday had   Fever of 101.3 last pm ID aches cough congestion. Severe malaise. Enzymatic treatment. Exposed to flu relatives and daughter began getting sick on the [place] from [place]. Did have the flu immunization this year. ROS: See pertinent positives and negatives per HPI. No unusual rashes headaches vomiting diarrhea. No history of underlying serious lung disease.  Past Medical History  Diagnosis Date  . GRAVE'S DISEASE 01/24/2007    No family history on file.  History   Social History  . Marital Status: Married    Spouse Name: N/A    Number of Children: N/A  . Years of Education: N/A   Social History Main Topics  . Smoking status: Never Smoker   . Smokeless tobacco: None  . Alcohol Use: None  . Drug Use: None  . Sexual Activity: None   Other Topics Concern  . None   Social History Narrative  . None    Outpatient Encounter Prescriptions as of 04/24/2013  Medication Sig  . clobetasol (TEMOVATE) 0.05 % external solution Apply 1 application topically as needed.   . diphenhydrAMINE (BENADRYL) 25 MG tablet Take 25 mg by mouth daily.    [name]. loratadine (CLARITIN) 10 MG tablet Take 10 mg by mouth daily.  . methimazole (TAPAZOLE) 10 MG tablet TAKE 1 TABLET 3 DAYS A WEEK, THEN TAKE 1/2 TABLET 4 DAYS A WEEK  . montelukast (SINGULAIR) 10 MG tablet Take 10 mg by mouth at bedtime.  . Multiple Vitamin (MULTIVITAMIN) tablet Take 1 tablet by mouth daily.    . Omega-3 Fatty Acids (FISH OIL) 500 MG CAPS Take by mouth. 1200 mg daily    . oseltamivir (TAMIFLU) 75 MG capsule Take 1 capsule (75 mg total) by mouth 2 (two) times daily.  . [DISCONTINUED] albuterol (PROVENTIL HFA;VENTOLIN HFA) 108 (90 BASE) MCG/ACT inhaler Inhale 2 puffs into the lungs  every 6 (six) hours as needed for wheezing.  . [DISCONTINUED] Glucosamine Sulfate 500 MG CAPS Take 2 capsules by mouth daily.      EXAM:  BP 128/72  Pulse 104  Temp(Src) 98.5 F (36.9 C) (Oral)  Ht 5' 4.5" (1.638 m)  Wt 125 lb (56.7 kg)  BMI 21.13 kg/m2  SpO2 99%  Body mass index is 21.13 kg/(m^2).  GENERAL: vitals reviewed and listed above, alert, oriented, appears well hydrated and in no acute distress ill non toxic  HEENT: atraumatic, conjunctiva  clear, no obvious abnormalities on inspection of external nose and ears tms clear  OP : no lesion edema or exudate on congestion NECK: no obvious masses on inspection palpation  No adenopathy supple LUNGS: clear to auscultation bilaterally, no wheezes, rales or rhonchi, good air movement CV: HRRR, no clubbing cyanosis  nl cap refill  Skin: normal capillary refill ,turgor , color: No acute rashes ,petechiae or bruising MS: moves all extremities without noticeable focal  abnormality PSYCH: pleasant and cooperative, no obvious depression or anxiety  ASSESSMENT AND PLAN:  Discussed the following assessment and plan:  Influenza with respiratory manifestations - pos exposure classic sx tamiflu benefot prob more than risk  Observe for complications expectant management -Patient advised to return or notify health care team  if symptoms worsen ,persist or new concerns arise.  Patient Instructions  I agree this is probably flu. Begin tamiflu immediately Expect fever to be gone in 48 - 72 hours  Contact us for fu if not  Cough last a while   Influenza, Adult Influenza ("the flu") is a viral infection of the respiratory tract. It occurs more often in winter months because people spend more time in close contact with one another. Influenza can make you feel very sick. Influenza easily spreads from person to person (contagious). CAUSES  Influenza is caused by a virus that infects the respiratory tract. You can catch the virus by breathing  in droplets from an infected person's cough or sneeze. You can also catch the virus by touching something that was recently contaminated with the virus and then touching your mouth, nose, or eyes. SYMPTOMS  Symptoms typically last 4 to 10 days and may include:  Fever.  Chills.  Headache, body aches, and muscle aches.  Sore throat.  Chest discomfort and cough.  Poor appetite.  Weakness or feeling tired.  Dizziness.  Nausea or vomiting. DIAGNOSIS  Diagnosis of influenza is often made based on your history and a physical exam. A nose or throat swab test can be done to confirm the diagnosis. RISKS AND COMPLICATIONS You may be at risk for a more severe case of influenza if you smoke cigarettes, have diabetes, have chronic heart disease (such as heart failure) or lung disease (such as asthma), or if you have a weakened immune system. Elderly people and pregnant women are also at risk for more serious infections. The most common complication of influenza is a lung infection (pneumonia). Sometimes, this complication can require emergency medical care and may be life-threatening. PREVENTION  An annual influenza vaccination (flu shot) is the best way to avoid getting influenza. An annual flu shot is now routinely recommended for all adults in the [place] TREATMENT  In mild cases, influenza goes away on its own. Treatment is directed at relieving symptoms. For more severe cases, your caregiver may prescribe antiviral medicines to shorten the sickness. Antibiotic medicines are not effective, because the infection is caused by a virus, not by bacteria. HOME CARE INSTRUCTIONS  Only take over-the-counter or prescription medicines for pain, discomfort, or fever as directed by your caregiver.  Use a cool mist humidifier to make breathing easier.  Get plenty of rest until your temperature returns to normal. This usually takes 3 to 4 days.  Drink enough fluids to keep your urine clear or pale  yellow.  Cover your mouth and nose when coughing or sneezing, and wash your hands well to avoid spreading the virus.  Stay home from work or school until your fever has been gone for at least 1 full day. SEEK MEDICAL CARE IF:   You have chest pain or a deep cough that worsens or produces more mucus.  You have nausea, vomiting, or diarrhea. SEEK IMMEDIATE MEDICAL CARE IF:   You have difficulty breathing, shortness of breath, or your skin or nails turn bluish.  You have severe neck pain or stiffness.  You have a severe headache, facial pain, or earache.  You have a worsening or recurring fever.  You have nausea or vomiting that cannot be controlled. MAKE SURE YOU:  Understand these instructions.  Will watch your condition.  Will get help right away if you are not doing well or get worse. Document Released: 01/22/2000 Document Revised: 07/26/2011 Document Reviewed: 04/25/2011 [facility] Patient Information 2014 [place], [place].    Neta Mends. Panosh [name]  Pre visit review using our clinic review tool, if applicable. No additional management support is needed unless otherwise documented below in the visit note.

## 2013-04-24 NOTE — Patient Instructions (Signed)
I agree this is probably flu. Begin tamiflu immediately Expect fever to be gone in 48 - 72 hours  Contact us for fu if not  Cough last a while   Influenza, Adult Influenza ("the flu") is a viral infection of the respiratory tract. It occurs more often in winter months because people spend more time in close contact with one another. Influenza can make you feel very sick. Influenza easily spreads from person to person (contagious). CAUSES  Influenza is caused by a virus that infects the respiratory tract. You can catch the virus by breathing in droplets from an infected person's cough or sneeze. You can also catch the virus by touching something that was recently contaminated with the virus and then touching your mouth, nose, or eyes. SYMPTOMS  Symptoms typically last 4 to 10 days and may include:  Fever.  Chills.  Headache, body aches, and muscle aches.  Sore throat.  Chest discomfort and cough.  Poor appetite.  Weakness or feeling tired.  Dizziness.  Nausea or vomiting. DIAGNOSIS  Diagnosis of influenza is often made based on your history and a physical exam. A nose or throat swab test can be done to confirm the diagnosis. RISKS AND COMPLICATIONS You may be at risk for a more severe case of influenza if you smoke cigarettes, have diabetes, have chronic heart disease (such as heart failure) or lung disease (such as asthma), or if you have a weakened immune system. Elderly people and pregnant women are also at risk for more serious infections. The most common complication of influenza is a lung infection (pneumonia). Sometimes, this complication can require emergency medical care and may be life-threatening. PREVENTION  An annual influenza vaccination (flu shot) is the best way to avoid getting influenza. An annual flu shot is now routinely recommended for all adults in the [place] TREATMENT  In mild cases, influenza goes away on its own. Treatment is directed at relieving symptoms.  For more severe cases, your caregiver may prescribe antiviral medicines to shorten the sickness. Antibiotic medicines are not effective, because the infection is caused by a virus, not by bacteria. HOME CARE INSTRUCTIONS  Only take over-the-counter or prescription medicines for pain, discomfort, or fever as directed by your caregiver.  Use a cool mist humidifier to make breathing easier.  Get plenty of rest until your temperature returns to normal. This usually takes 3 to 4 days.  Drink enough fluids to keep your urine clear or pale yellow.  Cover your mouth and nose when coughing or sneezing, and wash your hands well to avoid spreading the virus.  Stay home from work or school until your fever has been gone for at least 1 full day. SEEK MEDICAL CARE IF:   You have chest pain or a deep cough that worsens or produces more mucus.  You have nausea, vomiting, or diarrhea. SEEK IMMEDIATE MEDICAL CARE IF:   You have difficulty breathing, shortness of breath, or your skin or nails turn bluish.  You have severe neck pain or stiffness.  You have a severe headache, facial pain, or earache.  You have a worsening or recurring fever.  You have nausea or vomiting that cannot be controlled. MAKE SURE YOU:  Understand these instructions.  Will watch your condition.  Will get help right away if you are not doing well or get worse. Document Released: 01/22/2000 Document Revised: 07/26/2011 Document Reviewed: 04/25/2011 [facility] Patient Information 2014 [place], [place].

## 2013-09-06 ENCOUNTER — Encounter: Payer: Self-pay | Admitting: Gastroenterology

## 2013-12-16 LAB — HM MAMMOGRAPHY: HM Mammogram: NORMAL

## 2013-12-26 ENCOUNTER — Ambulatory Visit (INDEPENDENT_AMBULATORY_CARE_PROVIDER_SITE_OTHER): Payer: Managed Care, Other (non HMO) | Admitting: Family Medicine

## 2013-12-26 ENCOUNTER — Encounter: Payer: Self-pay | Admitting: Family Medicine

## 2013-12-26 VITALS — BP 118/68 | Temp 98.3°F | Wt 125.5 lb

## 2013-12-26 DIAGNOSIS — M79671 Pain in right foot: Secondary | ICD-10-CM

## 2013-12-26 DIAGNOSIS — E0500 Thyrotoxicosis with diffuse goiter without thyrotoxic crisis or storm: Secondary | ICD-10-CM

## 2013-12-26 DIAGNOSIS — Z23 Encounter for immunization: Secondary | ICD-10-CM

## 2013-12-26 DIAGNOSIS — M7711 Lateral epicondylitis, right elbow: Secondary | ICD-10-CM

## 2013-12-26 LAB — TSH: TSH: 1.69 u[IU]/mL (ref 0.35–4.50)

## 2013-12-26 LAB — T4, FREE: FREE T4: 0.73 ng/dL (ref 0.60–1.60)

## 2013-12-26 LAB — T3, FREE: T3, Free: 2.5 pg/mL (ref 2.3–4.2)

## 2013-12-26 MED ORDER — MELOXICAM 7.5 MG PO TABS: 7.5000 mg | ORAL_TABLET | Freq: Every day | ORAL | Status: DC

## 2013-12-26 MED ORDER — METHIMAZOLE 10 MG PO TABS: ORAL_TABLET | ORAL | Status: DC

## 2013-12-26 NOTE — Patient Instructions (Addendum)
Alisha Grave's disease- TSH, free T4, Free T3  Ms. Moscato Sounds like you need to stay on same dose, we will verify with labs.   Lateral epicondylitis/tennis elbow-meloxicam for 10 days with food and home exercise program  Not clear what is going on with your heel. Meloxicam may help. If it does not let me know. Consider sports medicine referral.

## 2013-12-26 NOTE — Progress Notes (Signed)
Tana ConchStephen Hunter, MD Phone: [phone]  Subjective:   Kristine [name] is a 58 y.o. 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 Formerly had diarrhea, heat intolerance, rapid pulse. Has done well on methimazole 5mg  4 days a week, 10mg  3 days a week. On this for 3 years. Originally diagnosed in 2007 based off of thyroid uptake scan with findings "The thyroid scan demonstrates homogeneous increased radiotracer uptake throughout an enlarged thyroid gland. The right lobe is larger than the left.  The 24-hour radioactive iodine uptake is equal to 52.6%. IMPRESSION: Findings consistent with Grave's disease."  ROS-No hair or nail changes. No heat/cold intolerance. No constipation or diarrhea. Denies shakiness or anxiety.   Elbow pain (right) Patient describes an aching pain of her right elbow for 2-3 months. Seems like her right elbow is bigger than her left elbow. She is tried Tylenol and icing. Pain is worse with yoga or with trying to turn her wrist inward. Pain rated as mild-to-moderate. She is not really taking it easy from yoga despite it making her pain worse, ROS-no hand weakness, no paresthesias  Right foot Pain 2-3 weeks lateral outside heel. Worse in AM with stretching. No injury or fall. No Pain with walking. Thought it was her plantar fasciitis but not in her normal site. She is this perhaps once or twice but no other medications tried. ROS-no foot weakness, no paresthesias  Past Medical History- Patient Active Problem List   Diagnosis Date Noted  . HYPERTHYROIDISM 04/23/2010  . PERSONAL HX COLONIC POLYPS 04/23/2010  . GRAVE'S DISEASE 01/24/2007   Medications- reviewed and updated Current Outpatient Prescriptions  Medication Sig Dispense Refill  . clobetasol (TEMOVATE) 0.05 % external solution Apply 1 application topically as needed.     . diphenhydrAMINE (BENADRYL) 25 MG tablet Take 25 mg  by mouth daily.      [name]. loratadine (CLARITIN) 10 MG tablet Take 10 mg by mouth daily.    . methimazole (TAPAZOLE) 10 MG tablet TAKE 1 TABLET 3 DAYS A WEEK, THEN TAKE 1/2 TABLET 4 DAYS A WEEK 60 tablet 3  . montelukast (SINGULAIR) 10 MG tablet Take 10 mg by mouth at bedtime.    . Multiple Vitamin (MULTIVITAMIN) tablet Take 1 tablet by mouth daily.      . Omega-3 Fatty Acids (FISH OIL) 500 MG CAPS Take by mouth. 1200 mg daily       No current facility-administered medications for this visit.    Objective: BP 118/68 mmHg  Temp(Src) 98.3 F (36.8 C) (Oral)  Wt 125 lb 8 oz (56.926 kg) Gen: NAD, resting comfortably in chair CV: RRR no murmurs rubs or gallops Lungs: CTAB no crackles, wheeze, rhonchi Ext: no edema Pain over right epicondyle with palpation and also with rotating hand inwards against resistance No pain over plantar fascia. Right outer heel with no pain to palpation (but reports this is where pain is when she stretches in AM or with downward facing dog) Skin: warm, dry, no rash or warmth over right elbow or right heel   Results for orders placed or performed in visit on 12/26/13 (from the past 24 hour(s))  TSH     Status: None   Collection Time: 12/26/13 11:40 AM  Result Value Ref Range   TSH 1.69 0.35 - 4.50 uIU/mL  T4, free     Status: None   Collection Time: 12/26/13 11:40 AM  Result Value Ref Range   Free T4  0.73 0.60 - 1.60 ng/dL  T3, Free     Status: None   Collection Time: 12/26/13 11:40 AM  Result Value Ref Range   T3, Free 2.5 2.3 - 4.2 pg/mL   Assessment/Plan:  Graves disease Hyperthyroidism persists as noted by TSH, free T4, free T3 not been depressed despite methimazole. Her levels are stable and we will continue her on her current dose of medication.  right lateral epicondylitis-meloxicam 10 days. Take it easy on yoga. Icing. Home exercise program advised. Given chronic nature 3-4 months this may be more difficult to control. We will consider sports  medicine referral if no improvement next month.  Right heel pain-unclear etiology. Atypical pattern and I believe this is muscular. Does not appear to be a stress fracture as no pain with walking. Not in location for her plantar fasciitis. Not location for Achilles tendinopathy either. We will try meloxicam as well for this as well. She will ice this area as well. Also refer to sports medicine if no improvement.  Return precautions advised.   Orders Placed This Encounter  Procedures  . Flu Vaccine QUAD 36+ mos PF IM (Fluarix Quad PF)

## 2013-12-26 NOTE — Assessment & Plan Note (Signed)
Hyperthyroidism persists as noted by TSH, free T4, free T3 not been depressed despite methimazole. Her levels are stable and we will continue her on her current dose of medication.

## 2014-01-29 ENCOUNTER — Encounter: Payer: Self-pay | Admitting: Internal Medicine

## 2014-05-05 ENCOUNTER — Encounter: Payer: Self-pay | Admitting: Gastroenterology

## 2014-05-30 ENCOUNTER — Telehealth: Payer: Self-pay | Admitting: Family Medicine

## 2014-05-30 ENCOUNTER — Ambulatory Visit: Payer: Self-pay | Admitting: Family Medicine

## 2014-05-30 NOTE — Telephone Encounter (Signed)
Lm on vm for pt.to cb °

## 2014-05-30 NOTE — Telephone Encounter (Signed)
Since I have already met her, I would be fine getting her in for a 15 minute visit in July or August for follow up and establish care. Does not have to be 30 minute slot.

## 2014-05-30 NOTE — Telephone Encounter (Signed)
See below [name] 

## 2014-05-30 NOTE — Telephone Encounter (Signed)
-----   Message from Crist InfanteKathy J Harrald sent at 05/30/2014  2:17 PM EDT ----- Regarding: canceled Same day Pt states she is having issues with her insurance company and they advised her not to keep her appt today at 3:30. Pt resc w/ Dr Selena BattenKim in July because you are too far out/  Pt was ok w/ that, just wants to get established w/someone

## 2014-06-02 NOTE — Telephone Encounter (Signed)
Yes and she was so happy and thankful!!

## 2014-06-02 NOTE — Telephone Encounter (Signed)
[facility]?

## 2014-06-06 ENCOUNTER — Encounter: Payer: Self-pay | Admitting: Gastroenterology

## 2014-06-19 ENCOUNTER — Encounter: Payer: Self-pay | Admitting: Gastroenterology

## 2014-08-06 ENCOUNTER — Ambulatory Visit (AMBULATORY_SURGERY_CENTER): Payer: Self-pay | Admitting: *Deleted

## 2014-08-06 VITALS — Ht 65.0 in | Wt 124.6 lb

## 2014-08-06 DIAGNOSIS — Z86010 Personal history of colonic polyps: Secondary | ICD-10-CM

## 2014-08-06 MED ORDER — MOVIPREP 100 G PO SOLR: 1.0000 | Freq: Once | ORAL | Status: DC

## 2014-08-06 NOTE — Progress Notes (Signed)
No egg or soy allergy Pt states she has nausea post op but no other issues  No diet pills No home 02  emmi declined

## 2014-08-15 ENCOUNTER — Ambulatory Visit: Payer: Self-pay | Admitting: Family Medicine

## 2014-08-15 ENCOUNTER — Ambulatory Visit (AMBULATORY_SURGERY_CENTER): Payer: Managed Care, Other (non HMO) | Admitting: Gastroenterology

## 2014-08-15 ENCOUNTER — Encounter: Payer: Self-pay | Admitting: Gastroenterology

## 2014-08-15 VITALS — BP 115/62 | HR 58 | Temp 97.2°F | Resp 19 | Ht 65.0 in | Wt 124.0 lb

## 2014-08-15 DIAGNOSIS — Z86010 Personal history of colonic polyps: Secondary | ICD-10-CM | POA: Diagnosis present

## 2014-08-15 MED ORDER — SODIUM CHLORIDE 0.9 % IV SOLN: 500.0000 mL | INTRAVENOUS | Status: DC

## 2014-08-15 NOTE — Progress Notes (Signed)
A/ox3 pleased with MAC, report to Tracy RN 

## 2014-08-15 NOTE — Patient Instructions (Signed)
See Procedure report for findings and recommendations  YOU HAD AN ENDOSCOPIC PROCEDURE TODAY AT THE [facility]:   Refer to the procedure report that was given to you for any specific questions about what was found during the examination.  If the procedure report does not answer your questions, please call your gastroenterologist to clarify.  If you requested that your care partner not be given the details of your procedure findings, then the procedure report has been included in a sealed envelope for you to review at your convenience later.  YOU SHOULD EXPECT: Some feelings of bloating in the abdomen. Passage of more gas than usual.  Walking can help get rid of the air that was put into your GI tract during the procedure and reduce the bloating. If you had a lower endoscopy (such as a colonoscopy or flexible sigmoidoscopy) you may notice spotting of blood in your stool or on the toilet paper. If you underwent a bowel prep for your procedure, you may not have a normal bowel movement for a few days.  Please Note:  You might notice some irritation and congestion in your nose or some drainage.  This is from the oxygen used during your procedure.  There is no need for concern and it should clear up in a day or so.  SYMPTOMS TO REPORT IMMEDIATELY:   Following lower endoscopy (colonoscopy or flexible sigmoidoscopy):  Excessive amounts of blood in the stool  Significant tenderness or worsening of abdominal pains  Swelling of the abdomen that is new, acute  Fever of 100F or higher   Following upper endoscopy (EGD)  Vomiting of blood or coffee ground material  New chest pain or pain under the shoulder blades  Painful or persistently difficult swallowing  New shortness of breath  Fever of 100F or higher  Black, tarry-looking stools  For urgent or emergent issues, a gastroenterologist can be reached at any hour by calling [phone].   DIET: Your first meal following the  procedure should be a small meal and then it is ok to progress to your normal diet. Heavy or fried foods are harder to digest and may make you feel nauseous or bloated.  Likewise, meals heavy in dairy and vegetables can increase bloating.  Drink plenty of fluids but you should avoid alcoholic beverages for 24 hours.  ACTIVITY:  You should plan to take it easy for the rest of today and you should NOT DRIVE or use heavy machinery until tomorrow (because of the sedation medicines used during the test).    FOLLOW UP: Our staff will call the number listed on your records the next business day following your procedure to check on you and address any questions or concerns that you may have regarding the information given to you following your procedure. If we do not reach you, we will leave a message.  However, if you are feeling well and you are not experiencing any problems, there is no need to return our call.  We will assume that you have returned to your regular daily activities without incident.  If any biopsies were taken you will be contacted by phone or by letter within the next 1-3 weeks.  Please call us at [phone] if you have not heard about the biopsies in 3 weeks.    SIGNATURES/CONFIDENTIALITY: You and/or your care partner have signed paperwork which will be entered into your electronic medical record.  These signatures attest to the fact that that the information above   on your After Visit Summary has been reviewed and is understood.  Full responsibility of the confidentiality of this discharge information lies with you and/or your care-partner.  Please follow all discharge instructions given to you by the recovery room nurse. If you have any questions or problems after discharge please call one of the numbers listed above. You will receive a phone call in the am to see how you are doing and answer any questions you may have. Thank you for choosing [facility] for your health  care needs. 

## 2014-08-15 NOTE — Op Note (Signed)
[facility] [address] [place], [id number]   COLONOSCOPY PROCEDURE REPORT  PATIENT: Kristine [name], Kristine [name]  MR#: [id number] BIRTHDATE: Mar 01, 1955 , 58  yrs. old GENDER: female ENDOSCOPIST: Rachael Feeaniel P Leita Lindbloom, MD PROCEDURE DATE:  08/15/2014 PROCEDURE:   Colonoscopy, surveillance First Screening Colonoscopy - Avg.  risk and is 50 yrs.  old or older - No.  Prior Negative Screening - Now for repeat screening. N/A  History of Adenoma - Now for follow-up colonoscopy & has been > or = to 3 yrs.  Yes hx of adenoma.  Has been 3 or more years since last colonoscopy.  Recommend repeat exam, <10 yrs? No ASA CLASS:   Class II INDICATIONS:Surveillance due to prior colonic neoplasia and small (subCM) tubular adenoma removed 2010 Colonoscopy. MEDICATIONS: Monitored anesthesia care and Propofol 200 mg IV  DESCRIPTION OF PROCEDURE:   After the risks benefits and alternatives of the procedure were thoroughly explained, informed consent was obtained.  The digital rectal exam revealed no abnormalities of the rectum.   The LB [id number]  endoscope was introduced through the anus and advanced to the cecum, which was identified by both the appendix and ileocecal valve. No adverse events experienced.   The quality of the prep was excellent.  The instrument was then slowly withdrawn as the colon was fully examined. Estimated blood loss is zero unless otherwise noted in this procedure report.   COLON FINDINGS: A normal appearing cecum, ileocecal valve, and appendiceal orifice were identified.  The ascending, transverse, descending, sigmoid colon, and rectum appeared unremarkable. Retroflexed views revealed no abnormalities. The time to cecum = 9.4 Withdrawal time = 8.1   The scope was withdrawn and the procedure completed. COMPLICATIONS: There were no immediate complications.  ENDOSCOPIC IMPRESSION: Normal colonoscopy No polyps or cancers  RECOMMENDATIONS: You should continue to  follow colorectal cancer screening guidelines for "routine risk" patients with a repeat colonoscopy in 10 years.   eSigned:  Rachael Feeaniel P Tavian Callander, MD 08/15/2014 8:52 AM   cc: Tana ConchStephen Hunter, MD

## 2014-08-18 ENCOUNTER — Telehealth: Payer: Self-pay | Admitting: *Deleted

## 2014-08-18 NOTE — Telephone Encounter (Signed)
  Follow up Call-  Call back number 08/15/2014  Post procedure Call Back phone  # [phone]  Permission to leave phone message Yes     No answer, left message.

## 2014-08-26 ENCOUNTER — Ambulatory Visit (INDEPENDENT_AMBULATORY_CARE_PROVIDER_SITE_OTHER): Payer: Managed Care, Other (non HMO) | Admitting: Family Medicine

## 2014-08-26 ENCOUNTER — Encounter: Payer: Self-pay | Admitting: Family Medicine

## 2014-08-26 VITALS — BP 100/60 | HR 59 | Temp 98.6°F | Wt 125.0 lb

## 2014-08-26 DIAGNOSIS — E0500 Thyrotoxicosis with diffuse goiter without thyrotoxic crisis or storm: Secondary | ICD-10-CM | POA: Diagnosis not present

## 2014-08-26 DIAGNOSIS — L309 Dermatitis, unspecified: Secondary | ICD-10-CM | POA: Insufficient documentation

## 2014-08-26 DIAGNOSIS — J309 Allergic rhinitis, unspecified: Secondary | ICD-10-CM | POA: Insufficient documentation

## 2014-08-26 NOTE — Progress Notes (Signed)
Tana ConchStephen Lynsee Wands, MD  Subjective:  Kristine [name] is a 59 y.o. 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 Compliant and tolerating methimazole 5mg  4 days a week, 10mg  3 days a week. On this for 3 years. Originally diagnosed in 2007 based off of thyroid uptake scan. Patient not interested in surgery, has declined radioactive iodine treatment in past.  ROS-No hair or nail changes. No heat/cold intolerance. No constipation or diarrhea. Denies shakiness or anxiety.   Past Medical History- allergic rhinitis, eczema  Medications- reviewed and updated Current Outpatient Prescriptions  Medication Sig Dispense Refill  . clobetasol (TEMOVATE) 0.05 % external solution Apply 1 application topically as needed.     . diphenhydrAMINE (BENADRYL) 25 MG tablet Take 25 mg by mouth daily.      [name]. loratadine (CLARITIN) 10 MG tablet Take 10 mg by mouth daily.    . methimazole (TAPAZOLE) 10 MG tablet TAKE 1 TABLET 3 DAYS A WEEK, THEN TAKE 1/2 TABLET 4 DAYS A WEEK 60 tablet 5  . Multiple Vitamin (MULTIVITAMIN) tablet Take 1 tablet by mouth daily.      . Omega-3 Fatty Acids (FISH OIL) 500 MG CAPS Take by mouth. 1200 mg daily       Objective: BP 100/60 mmHg  Pulse 59  Temp(Src) 98.6 F (37 C)  Wt 125 lb (56.7 kg)  LMP 03/31/2010 Gen: NAD, resting comfortably nontender but enlarged thyroid, not visibly enlarged CV: RRR no murmurs rubs or gallops Lungs: CTAB no crackles, wheeze, rhonchi Abdomen: soft/nontender/nondistended/normal bowel sounds. Ext: no edema Skin: warm, dry Neuro: grossly normal, moves all extremities  Assessment/Plan:  Graves disease Check TSH, T3, T4, tempted to either decrease dose at this point or if stable in 6 months. Given duration of treatment moving towards at least 10 years, could consider endocrine consult as well at  some point. Up to date advises discussing duration of treatment as 1-2 years but that some patients have done well with 10 years treatment. Also could simply could continue current dose and continue to monitor labs.    6 month f/u. Return precautions advised.   Orders Placed This Encounter  Procedures  . TSH    [place]  . T4, free    [facility]  . T3, Free  . CBC    [place]  . Comprehensive metabolic panel    [place]

## 2014-08-26 NOTE — Assessment & Plan Note (Signed)
Check TSH, T3, T4, tempted to either decrease dose at this point or if stable in 6 months. Given duration of treatment moving towards at least 10 years, could consider endocrine consult as well at some point. Up to date advises discussing duration of treatment as 1-2 years but that some patients have done well with 10 years treatment. Also could simply could continue current dose and continue to monitor labs.

## 2014-08-26 NOTE — Patient Instructions (Addendum)
Get PAP scheduled and have records faxed to us at [phone].  Great to see you  Check thyroid levels and some other baseline labs to update.   See us in 6 months

## 2014-08-27 LAB — COMPREHENSIVE METABOLIC PANEL
ALBUMIN: 4.4 g/dL (ref 3.5–5.2)
ALK PHOS: 74 U/L (ref 39–117)
ALT: 24 U/L (ref 0–35)
AST: 31 U/L (ref 0–37)
BILIRUBIN TOTAL: 0.6 mg/dL (ref 0.2–1.2)
BUN: 16 mg/dL (ref 6–23)
CO2: 30 meq/L (ref 19–32)
Calcium: 9.7 mg/dL (ref 8.4–10.5)
Chloride: 106 mEq/L (ref 96–112)
Creatinine, Ser: 0.81 mg/dL (ref 0.40–1.20)
GFR: 77.56 mL/min (ref >60.00)
GLUCOSE: 95 mg/dL (ref 70–99)
Potassium: 3.8 mEq/L (ref 3.5–5.1)
Sodium: 141 mEq/L (ref 135–145)
TOTAL PROTEIN: 6.8 g/dL (ref 6.0–8.3)

## 2014-08-27 LAB — CBC
HCT: 41.6 % (ref 36.0–46.0)
HEMOGLOBIN: 14 g/dL (ref 12.0–15.0)
MCHC: 33.7 g/dL (ref 30.0–36.0)
MCV: 94.8 fl (ref 78.0–100.0)
PLATELETS: 213 10*3/uL (ref 150.0–400.0)
RBC: 4.39 Mil/uL (ref 3.87–5.11)
RDW: 13.1 % (ref 11.5–15.5)
WBC: 6.8 10*3/uL (ref 4.0–10.5)

## 2014-08-27 LAB — T3, FREE: T3, Free: 2.7 pg/mL (ref 2.3–4.2)

## 2014-08-27 LAB — TSH: TSH: 1.95 u[IU]/mL (ref 0.35–4.50)

## 2014-08-27 LAB — T4, FREE: FREE T4: 0.67 ng/dL (ref 0.60–1.60)

## 2014-10-28 ENCOUNTER — Emergency Department (HOSPITAL_COMMUNITY)
Admission: EM | Admit: 2014-10-28 | Discharge: 2014-10-28 | Disposition: A | Discharge status: Home / Self Care | Payer: Managed Care, Other (non HMO) | Attending: Emergency Medicine | Admitting: Emergency Medicine

## 2014-10-28 ENCOUNTER — Encounter (HOSPITAL_COMMUNITY): Payer: Self-pay | Admitting: Emergency Medicine

## 2014-10-28 ENCOUNTER — Emergency Department (HOSPITAL_COMMUNITY): Payer: Managed Care, Other (non HMO)

## 2014-10-28 DIAGNOSIS — S93401A Sprain of unspecified ligament of right ankle, initial encounter: Secondary | ICD-10-CM | POA: Insufficient documentation

## 2014-10-28 DIAGNOSIS — Y9389 Activity, other specified: Secondary | ICD-10-CM | POA: Diagnosis not present

## 2014-10-28 DIAGNOSIS — Z79899 Other long term (current) drug therapy: Secondary | ICD-10-CM | POA: Diagnosis not present

## 2014-10-28 DIAGNOSIS — S99921A Unspecified injury of right foot, initial encounter: Secondary | ICD-10-CM | POA: Insufficient documentation

## 2014-10-28 DIAGNOSIS — Y998 Other external cause status: Secondary | ICD-10-CM | POA: Diagnosis not present

## 2014-10-28 DIAGNOSIS — Z8639 Personal history of other endocrine, nutritional and metabolic disease: Secondary | ICD-10-CM | POA: Diagnosis not present

## 2014-10-28 DIAGNOSIS — S99911A Unspecified injury of right ankle, initial encounter: Secondary | ICD-10-CM | POA: Diagnosis present

## 2014-10-28 DIAGNOSIS — X58XXXA Exposure to other specified factors, initial encounter: Secondary | ICD-10-CM | POA: Diagnosis not present

## 2014-10-28 DIAGNOSIS — Y9289 Other specified places as the place of occurrence of the external cause: Secondary | ICD-10-CM | POA: Diagnosis not present

## 2014-10-28 NOTE — Discharge Instructions (Signed)
Ankle Sprain °An ankle sprain is an injury to the strong, fibrous tissues (ligaments) that hold the bones of your ankle joint together.  °CAUSES °An ankle sprain is usually caused by a fall or by twisting your ankle. Ankle sprains most commonly occur when you step on the outer edge of your foot, and your ankle turns inward. People who participate in sports are more prone to these types of injuries.  °SYMPTOMS  °· Pain in your ankle. The pain may be present at rest or only when you are trying to stand or walk. °· Swelling. °· Bruising. Bruising may develop immediately or within 1 to 2 days after your injury. °· Difficulty standing or walking, particularly when turning corners or changing directions. °DIAGNOSIS  °Your caregiver will ask you details about your injury and perform a physical exam of your ankle to determine if you have an ankle sprain. During the physical exam, your caregiver will press on and apply pressure to specific areas of your foot and ankle. Your caregiver will try to move your ankle in certain ways. An X-ray exam may be done to be sure a bone was not broken or a ligament did not separate from one of the bones in your ankle (avulsion fracture).  °TREATMENT  °Certain types of braces can help stabilize your ankle. Your caregiver can make a recommendation for this. Your caregiver may recommend the use of medicine for pain. If your sprain is severe, your caregiver may refer you to a surgeon who helps to restore function to parts of your skeletal system (orthopedist) or a physical therapist. °HOME CARE INSTRUCTIONS  °· Apply ice to your injury for 1-2 days or as directed by your caregiver. Applying ice helps to reduce inflammation and pain. °· Put ice in a plastic bag. °· Place a towel between your skin and the bag. °· Leave the ice on for 15-20 minutes at a time, every 2 hours while you are awake. °· Only take over-the-counter or prescription medicines for pain, discomfort, or fever as directed by  your caregiver. °· Elevate your injured ankle above the level of your heart as much as possible for 2-3 days. °· If your caregiver recommends crutches, use them as instructed. Gradually put weight on the affected ankle. Continue to use crutches or a cane until you can walk without feeling pain in your ankle. °· If you have a plaster splint, wear the splint as directed by your caregiver. Do not rest it on anything harder than a pillow for the first 24 hours. Do not put weight on it. Do not get it wet. You may take it off to take a shower or bath. °· You may have been given an elastic bandage to wear around your ankle to provide support. If the elastic bandage is too tight (you have numbness or tingling in your foot or your foot becomes cold and blue), adjust the bandage to make it comfortable. °· If you have an air splint, you may blow more air into it or let air out to make it more comfortable. You may take your splint off at night and before taking a shower or bath. Wiggle your toes in the splint several times per day to decrease swelling. °SEEK MEDICAL CARE IF:  °· You have rapidly increasing bruising or swelling. °· Your toes feel extremely cold or you lose feeling in your foot. °· Your pain is not relieved with medicine. °SEEK IMMEDIATE MEDICAL CARE IF: °· Your toes are numb or blue. °·   You have severe pain that is increasing. °MAKE SURE YOU:  °· Understand these instructions. °· Will watch your condition. °· Will get help right away if you are not doing well or get worse. °Document Released: 01/24/2005 Document Revised: 10/19/2011 Document Reviewed: 02/05/2011 °ExitCare® Patient Information ©2015 [employer]. This information is not intended to replace advice given to you by your health care provider. Make sure you discuss any questions you have with your health care provider. °Cryotherapy °Cryotherapy means treatment with cold. Ice or gel packs can be used to reduce both pain and swelling. Ice is the most  helpful within the first 24 to 48 hours after an injury or flare-up from overusing a muscle or joint. Sprains, strains, spasms, burning pain, shooting pain, and aches can all be eased with ice. Ice can also be used when recovering from surgery. Ice is effective, has very few side effects, and is safe for most people to use. °PRECAUTIONS  °Ice is not a safe treatment option for people with: °· Raynaud phenomenon. This is a condition affecting small blood vessels in the extremities. Exposure to cold may cause your problems to return. °· Cold hypersensitivity. There are many forms of cold hypersensitivity, including: °¨ Cold urticaria. Red, itchy hives appear on the skin when the tissues begin to warm after being iced. °¨ Cold erythema. This is a red, itchy rash caused by exposure to cold. °¨ Cold hemoglobinuria. Red blood cells break down when the tissues begin to warm after being iced. The hemoglobin that carry oxygen are passed into the urine because they cannot combine with blood proteins fast enough. °· Numbness or altered sensitivity in the area being iced. °If you have any of the following conditions, do not use ice until you have discussed cryotherapy with your caregiver: °· Heart conditions, such as arrhythmia, angina, or chronic heart disease. °· High blood pressure. °· Healing wounds or open skin in the area being iced. °· Current infections. °· Rheumatoid arthritis. °· Poor circulation. °· Diabetes. °Ice slows the blood flow in the region it is applied. This is beneficial when trying to stop inflamed tissues from spreading irritating chemicals to surrounding tissues. However, if you expose your skin to cold temperatures for too long or without the proper protection, you can damage your skin or nerves. Watch for signs of skin damage due to cold. °HOME CARE INSTRUCTIONS °Follow these tips to use ice and cold packs safely. °· Place a dry or damp towel between the ice and skin. A damp towel will cool the skin  more quickly, so you may need to shorten the time that the ice is used. °· For a more rapid response, add gentle compression to the ice. °· Ice for no more than 10 to 20 minutes at a time. The bonier the area you are icing, the less time it will take to get the benefits of ice. °· Check your skin after 5 minutes to make sure there are no signs of a poor response to cold or skin damage. °· Rest 20 minutes or more between uses. °· Once your skin is numb, you can end your treatment. You can test numbness by very lightly touching your skin. The touch should be so light that you do not see the skin dimple from the pressure of your fingertip. When using ice, most people will feel these normal sensations in this order: cold, burning, aching, and numbness. °· Do not use ice on someone who cannot communicate their responses to pain,   such as small children or people with dementia. °HOW TO MAKE AN ICE PACK °Ice packs are the most common way to use ice therapy. Other methods include ice massage, ice baths, and cryosprays. Muscle creams that cause a cold, tingly feeling do not offer the same benefits that ice offers and should not be used as a substitute unless recommended by your caregiver. °To make an ice pack, do one of the following: °· Place crushed ice or a bag of frozen vegetables in a sealable plastic bag. Squeeze out the excess air. Place this bag inside another plastic bag. Slide the bag into a pillowcase or place a damp towel between your skin and the bag. °· Mix 3 parts water with 1 part rubbing alcohol. Freeze the mixture in a sealable plastic bag. When you remove the mixture from the freezer, it will be slushy. Squeeze out the excess air. Place this bag inside another plastic bag. Slide the bag into a pillowcase or place a damp towel between your skin and the bag. °SEEK MEDICAL CARE IF: °· You develop white spots on your skin. This may give the skin a blotchy (mottled) appearance. °· Your skin turns blue or  pale. °· Your skin becomes waxy or hard. °· Your swelling gets worse. °MAKE SURE YOU:  °· Understand these instructions. °· Will watch your condition. °· Will get help right away if you are not doing well or get worse. °Document Released: 09/20/2010 Document Revised: 06/10/2013 Document Reviewed: 09/20/2010 °ExitCare® Patient Information ©2015 [employer]. This information is not intended to replace advice given to you by your health care provider. Make sure you discuss any questions you have with your health care provider. ° °

## 2014-10-28 NOTE — ED Provider Notes (Signed)
CSN: [id number]     Arrival date & time 10/28/14  2027 History  This chart was scribed for non-physician practitioner working with Eber Hong, MD, by Jarvis Morgan, ED Scribe. This patient was seen in room WTR9/WTR9 and the patient's care was started at 10:43 PM.     Chief Complaint  Patient presents with  . Foot Pain  . Ankle Pain   The history is provided by the patient. No language interpreter was used.    HPI Comments: Kristine [name] is a 59 y.o. female who presents to the Emergency Department complaining of constant, moderate, right ankle and right foot pain onset tonight. She denies any known injury. She endorses she is unable to place any weight on the foot and states it significantly exacerbates the pain. Pt took 2 extra strength Tylenol PTA with moderate relief. She notes she fractured the foot 40 years ago but denies any more recent injuries. Pt reports several weeks ago she noticed mild bruising to the right lateral ankle but states that has resolved at this time. She denies any swelling, numbness, or weakness. NKDA   Past Medical History  Diagnosis Date  . GRAVE'S DISEASE 01/24/2007  . Allergy    Past Surgical History  Procedure Laterality Date  . Tonsillectomy  1972  . 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 Colonoscopy  2010, 2016    polyp in 2010, none 2016  . Carpal tunnel release      3-4 yeras ago from 2016   Family History  Problem Relation Age of Onset  . Heart disease Father     51 MI- 4 stents incl. LAD, former smoker distant  . Lung disease Mother     "shock lung"  . Colon cancer Neg Hx   . Colon polyps Neg Hx   . Rectal cancer Neg Hx   . Stomach cancer Neg Hx   . Esophageal cancer Neg Hx    Social History  Substance Use Topics  . Smoking status: Never Smoker   . Smokeless tobacco: Never Used  . Alcohol Use: No   OB History    No data available     Review of Systems  Musculoskeletal: Positive for myalgias, arthralgias and gait problem  (antalgic). Negative for joint swelling.  Skin: Negative for color change.  Neurological: Negative for weakness and numbness.      Allergies  Review of patient's allergies indicates no known allergies.  Home Medications   Prior to Admission medications   Medication Sig Start Date End Date Taking? Authorizing Provider  clobetasol (TEMOVATE) 0.05 % external solution Apply 1 application topically as needed.     Historical Provider, MD  diphenhydrAMINE (BENADRYL) 25 MG tablet Take 25 mg by mouth daily.      Historical Provider, MD  loratadine (CLARITIN) 10 MG tablet Take 10 mg by mouth daily.    Historical Provider, MD  methimazole (TAPAZOLE) 10 MG tablet TAKE 1 TABLET 3 DAYS A WEEK, THEN TAKE 1/2 TABLET 4 DAYS A WEEK 12/26/13   [name], MD  Multiple Vitamin (MULTIVITAMIN) tablet Take 1 tablet by mouth daily.      Historical Provider, MD  Omega-3 Fatty Acids (FISH OIL) 500 MG CAPS Take by mouth. 1200 mg daily      Historical Provider, MD   Triage Vitals: BP 102/54 mmHg  Pulse 53  Temp(Src) 98 F (36.7 C) (Oral)  Resp 14  SpO2 99%  LMP 03/31/2010  Physical Exam  Constitutional: She  is oriented to person, place, and time. She appears well-developed and well-nourished. No distress.  HENT:  Head: Normocephalic and atraumatic.  Eyes: Conjunctivae and EOM are normal.  Neck: Neck supple. No tracheal deviation present.  Cardiovascular: Normal rate.   Pulmonary/Chest: Effort normal. No respiratory distress.  Musculoskeletal: Normal range of motion.  Right ankle is minimally swollen laterally, no discoloration. There is full and plantar and dorsiflexion with discomfort. Joint is stable. No calf tenderness. Distal pulses intact  Neurological: She is alert and oriented to person, place, and time.  Skin: Skin is warm and dry.  Psychiatric: She has a normal mood and affect. Her behavior is normal.  Nursing note and vitals reviewed.   ED Course  Procedures (including critical  care time)  DIAGNOSTIC STUDIES: Oxygen Saturation is 99% on RA, normal by my interpretation.    COORDINATION OF CARE: 9:14 PM- Will order imaging of right ankle and right foot. Pt advised of plan for treatment and pt agrees.  10:46 PM- Advised if problem continues to f/u with an orthopedic. Pt states she has seen Delbert Harness in the past. Recommended she could take 800 mg Ibuprofen every 8 hrs daily.  10:57 PM- Will d/c pt home with ASO ankle and crutches. Pt advised of plan for treatment and pt agrees.   Labs Review Labs Reviewed - No data to display  Imaging Review Dg Ankle Complete Right  10/28/2014   CLINICAL DATA:  59 year old female left right ankle injury and pain.  EXAM: RIGHT ANKLE - COMPLETE 3+ VIEW; RIGHT FOOT COMPLETE - 3+ VIEW  COMPARISON:  None.  FINDINGS: There is no evidence of fracture, dislocation, or joint effusion. Mild osteopenia. There is no evidence of arthropathy or other focal bone abnormality. Soft tissues are unremarkable.  IMPRESSION: No acute fracture or dislocation.   Electronically Signed   By: Elgie Collard [name]   On: 10/28/2014 22:13   Dg Foot Complete Right  10/28/2014   CLINICAL DATA:  59 year old female left right ankle injury and pain.  EXAM: RIGHT ANKLE - COMPLETE 3+ VIEW; RIGHT FOOT COMPLETE - 3+ VIEW  COMPARISON:  None.  FINDINGS: There is no evidence of fracture, dislocation, or joint effusion. Mild osteopenia. There is no evidence of arthropathy or other focal bone abnormality. Soft tissues are unremarkable.  IMPRESSION: No acute fracture or dislocation.   Electronically Signed   By: Elgie Collard [name]   On: 10/28/2014 22:13   I have personally reviewed and evaluated these images and lab results as part of my medical decision-making.   EKG Interpretation None      MDM   Final diagnoses:  None    1. Ankle sprain, right  Supportive measures provided in non-fracture pain. Ortho f/u recommended. No evidence infection or vascular  compromise.   I personally performed the services described in this documentation, which was scribed in my presence. The recorded information has been reviewed and is accurate.      Elpidio Anis, PA-C 11/04/14 0505  Eber Hong, MD 11/07/14 [phone]

## 2014-10-28 NOTE — ED Notes (Signed)
Ortho at bedside.

## 2014-10-28 NOTE — ED Notes (Addendum)
Pt reports right ankle and foot pain starting this evening. Unable to place weight on foot. Hx fracturing foot 40 years ago. Remembers a couple of weeks ago noticing her right lateral ankle being "discolored" with no pain. Denies numbness/tingling in foot. Took 2 extra strength Tylenol before arriving at ED.

## 2014-10-28 NOTE — ED Notes (Signed)
PA at bedside.

## 2014-12-26 ENCOUNTER — Other Ambulatory Visit: Payer: Self-pay | Admitting: Family Medicine

## 2015-03-06 ENCOUNTER — Encounter: Payer: Self-pay | Admitting: Family Medicine

## 2015-03-06 LAB — HM MAMMOGRAPHY: HM Mammogram: NORMAL

## 2015-10-21 LAB — HM PAP SMEAR: HM Pap smear: NEGATIVE

## 2016-01-12 ENCOUNTER — Telehealth: Payer: Self-pay | Admitting: Family Medicine

## 2016-01-18 ENCOUNTER — Other Ambulatory Visit: Payer: Self-pay | Admitting: Family Medicine

## 2016-02-08 DIAGNOSIS — H409 Unspecified glaucoma: Secondary | ICD-10-CM | POA: Insufficient documentation

## 2016-02-09 ENCOUNTER — Encounter: Payer: Self-pay | Admitting: Family Medicine

## 2016-02-09 ENCOUNTER — Ambulatory Visit (INDEPENDENT_AMBULATORY_CARE_PROVIDER_SITE_OTHER): Payer: Managed Care, Other (non HMO) | Admitting: Family Medicine

## 2016-02-09 ENCOUNTER — Other Ambulatory Visit: Payer: Self-pay | Admitting: Family Medicine

## 2016-02-09 VITALS — BP 132/68 | HR 58 | Temp 98.1°F | Ht 65.0 in | Wt 126.0 lb

## 2016-02-09 DIAGNOSIS — Z23 Encounter for immunization: Secondary | ICD-10-CM

## 2016-02-09 DIAGNOSIS — Z780 Asymptomatic menopausal state: Secondary | ICD-10-CM | POA: Diagnosis not present

## 2016-02-09 DIAGNOSIS — Z7289 Other problems related to lifestyle: Secondary | ICD-10-CM

## 2016-02-09 DIAGNOSIS — E0500 Thyrotoxicosis with diffuse goiter without thyrotoxic crisis or storm: Secondary | ICD-10-CM

## 2016-02-09 DIAGNOSIS — K529 Noninfective gastroenteritis and colitis, unspecified: Secondary | ICD-10-CM | POA: Diagnosis not present

## 2016-02-09 DIAGNOSIS — Z1159 Encounter for screening for other viral diseases: Secondary | ICD-10-CM

## 2016-02-09 NOTE — Assessment & Plan Note (Addendum)
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 She is taking methimazole 10 mg 3 days a week, 5 mg 4 days a week. States in past was on doses up to 30mg  a day and has been weaned down under Dr. Cato MulliganSwords A/P: update tsh, t3, t4 but also cbc, cmp. Suspect controlled. With diarrhea also get tissue transglutaminase IGA primarily for her daughter who has celiac who is worried about her mom (tried gluten free for a month with no improvement in symptoms). Discussed trial of 5 mg daily of methimazole if levels normal- would likely repeat labs in about 10 weeks if made this change.   Also discussed definitive treatments or endocrine referral- which she firmly declines at present.

## 2016-02-09 NOTE — Progress Notes (Signed)
Pre visit review using our clinic review tool, if applicable. No additional management support is needed unless otherwise documented below in the visit note. 

## 2016-02-09 NOTE — Patient Instructions (Addendum)
Sign release of information at the check out desk for last pap smear  Schedule your bone density test at check out desk as well  Labs before you leave  Tdap today

## 2016-02-09 NOTE — Progress Notes (Signed)
Subjective:  Sherrilyn Ristleanor S Stan is a 61 y.o. year old very pleasant female patient who presents for/with See problem oriented charting ROS- -No hair or nail changes. No heat/cold intolerance. No constipation.  Denies shakiness or anxiety.   Past Medical History-  Patient Active Problem List   Diagnosis Date Noted  . Graves disease 01/24/2007    Priority: Medium  . Eczema 08/26/2014    Priority: Low  . Allergic rhinitis 08/26/2014    Priority: Low  . History of colonic polyps 04/23/2010    Priority: Low    Medications- reviewed and updated Current Outpatient Prescriptions  Medication Sig Dispense Refill  . clobetasol (TEMOVATE) 0.05 % external solution Apply 1 application topically as needed.     . diphenhydrAMINE (BENADRYL) 25 MG tablet Take 25 mg by mouth daily.      [name]. loratadine (CLARITIN) 10 MG tablet Take 10 mg by mouth daily.    . meloxicam (MOBIC) 15 MG tablet Take 15 mg by mouth daily.    . methimazole (TAPAZOLE) 10 MG tablet TAKE 1 TABLET 3 DAYS A WEEK, THEN TAKE 1/2 TABLET 4 DAYS A WEEK 60 tablet 0  . Multiple Vitamin (MULTIVITAMIN) tablet Take 1 tablet by mouth daily.      . Omega-3 Fatty Acids (FISH OIL) 500 MG CAPS Take by mouth. 1200 mg daily       No current facility-administered medications for this visit.     Objective: BP 132/68 (BP Location: Left Arm, Patient Position: Sitting, Cuff Size: Normal)   Pulse (!) 58   Temp 98.1 F (36.7 C) (Oral)   Ht 5\' 5"  (1.651 m)   Wt 126 lb (57.2 kg)   LMP 03/31/2010   SpO2 95%   BMI 20.97 kg/m  Gen: NAD, resting comfortably, appears younger than stated age Mild thyromegaly noted2 CV: RRR no murmurs rubs or gallops Lungs: CTAB no crackles, wheeze, rhonchi.  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 She is taking methimazole 10 mg 3 days a week, 5 mg 4 days a week. States in past  was on doses up to 30mg  a day and has been weaned down under Dr. Cato MulliganSwords A/P: update tsh, t3, t4 but also cbc, cmp. Suspect controlled. With diarrhea also get tissue transglutaminase IGA primarily for her daughter who has celiac who is worried about her mom (tried gluten free for a month with no improvement in symptoms)  grandbaby 8 months old and never had pertussis vaccination so will do this today Orders Placed This Encounter  Procedures  . DG Bone Density    Standing Status:   Future    Standing Expiration Date:   04/08/2017    Order Specific Question:   Reason for Exam (SYMPTOM  OR DIAGNOSIS REQUIRED)    Answer:   postmenopausal and graves disease- treated methimazole since 2007    Order Specific Question:   Is the patient pregnant?    Answer:   No    Order Specific Question:   Preferred imaging location?    Answer:   [facility]  . Tdap vaccine greater than or equal to 7yo IM  . Tissue Transglutaminase, IGA  . TSH    [place]  . T4, free    [place]  . T3, free  . CBC    [facility]  . Comprehensive metabolic panel    [place]  . Hepatitis C antibody,  reflex    [facility]  also bone density reasonable though graves treated as also postmenopausal  Return precautions advised.  Tana Conch, MD

## 2016-02-10 ENCOUNTER — Other Ambulatory Visit: Payer: Self-pay

## 2016-02-10 DIAGNOSIS — E0500 Thyrotoxicosis with diffuse goiter without thyrotoxic crisis or storm: Secondary | ICD-10-CM

## 2016-02-10 LAB — COMPREHENSIVE METABOLIC PANEL
ALK PHOS: 61 U/L (ref 39–117)
ALT: 25 U/L (ref 0–35)
AST: 28 U/L (ref 0–37)
Albumin: 4.1 g/dL (ref 3.5–5.2)
BUN: 14 mg/dL (ref 6–23)
CHLORIDE: 107 meq/L (ref 96–112)
CO2: 27 mEq/L (ref 19–32)
Calcium: 9.2 mg/dL (ref 8.4–10.5)
Creatinine, Ser: 0.72 mg/dL (ref 0.40–1.20)
GFR: 88.4 mL/min (ref >60.00)
Glucose, Bld: 95 mg/dL (ref 70–99)
POTASSIUM: 4.1 meq/L (ref 3.5–5.1)
SODIUM: 140 meq/L (ref 135–145)
TOTAL PROTEIN: 6.1 g/dL (ref 6.0–8.3)
Total Bilirubin: 0.5 mg/dL (ref 0.2–1.2)

## 2016-02-10 LAB — TSH: TSH: 1.68 u[IU]/mL (ref 0.35–4.50)

## 2016-02-10 LAB — CBC
HEMATOCRIT: 40 % (ref 36.0–46.0)
HEMOGLOBIN: 13.8 g/dL (ref 12.0–15.0)
MCHC: 34.4 g/dL (ref 30.0–36.0)
MCV: 94.5 fl (ref 78.0–100.0)
Platelets: 205 10*3/uL (ref 150.0–400.0)
RBC: 4.23 Mil/uL (ref 3.87–5.11)
RDW: 13.2 % (ref 11.5–15.5)
WBC: 6.7 10*3/uL (ref 4.0–10.5)

## 2016-02-10 LAB — T4, FREE: Free T4: 0.72 ng/dL (ref 0.60–1.60)

## 2016-02-10 LAB — T3, FREE: T3, Free: 2.6 pg/mL (ref 2.3–4.2)

## 2016-02-10 LAB — HEPATITIS C ANTIBODY: HCV AB: NEGATIVE

## 2016-02-10 LAB — TISSUE TRANSGLUTAMINASE, IGA: Tissue Transglutaminase Ab, IgA: 1 U/mL (ref <4)

## 2016-02-10 MED ORDER — METHIMAZOLE 10 MG PO TABS: ORAL_TABLET | ORAL | 0 refills | Status: DC

## 2016-02-12 ENCOUNTER — Encounter: Payer: Self-pay | Admitting: Family Medicine

## 2016-02-15 ENCOUNTER — Inpatient Hospital Stay: Admission: RE | Admit: 2016-02-15 | Payer: Managed Care, Other (non HMO) | Source: Ambulatory Visit

## 2016-02-18 ENCOUNTER — Other Ambulatory Visit: Payer: Self-pay | Admitting: Family Medicine

## 2016-02-23 ENCOUNTER — Ambulatory Visit (INDEPENDENT_AMBULATORY_CARE_PROVIDER_SITE_OTHER)
Admission: RE | Admit: 2016-02-23 | Discharge: 2016-02-23 | Disposition: A | Discharge status: Home / Self Care | Payer: Managed Care, Other (non HMO) | Source: Ambulatory Visit | Attending: Family Medicine | Admitting: Family Medicine

## 2016-02-23 DIAGNOSIS — E0500 Thyrotoxicosis with diffuse goiter without thyrotoxic crisis or storm: Secondary | ICD-10-CM

## 2016-02-23 DIAGNOSIS — Z780 Asymptomatic menopausal state: Secondary | ICD-10-CM | POA: Diagnosis not present

## 2016-03-31 NOTE — Telephone Encounter (Signed)
error 

## 2016-04-07 LAB — HM MAMMOGRAPHY

## 2016-04-11 ENCOUNTER — Encounter: Payer: Self-pay | Admitting: Family Medicine

## 2016-05-11 ENCOUNTER — Other Ambulatory Visit: Payer: Self-pay | Admitting: Family Medicine

## 2016-05-24 ENCOUNTER — Other Ambulatory Visit: Payer: Managed Care, Other (non HMO)

## 2016-05-25 ENCOUNTER — Other Ambulatory Visit: Payer: Managed Care, Other (non HMO)

## 2016-05-25 ENCOUNTER — Other Ambulatory Visit (INDEPENDENT_AMBULATORY_CARE_PROVIDER_SITE_OTHER): Payer: Managed Care, Other (non HMO)

## 2016-05-25 DIAGNOSIS — E0500 Thyrotoxicosis with diffuse goiter without thyrotoxic crisis or storm: Secondary | ICD-10-CM | POA: Diagnosis not present

## 2016-05-26 LAB — T4, FREE: Free T4: 0.76 ng/dL (ref 0.60–1.60)

## 2016-05-26 LAB — TSH: TSH: 0.8 u[IU]/mL (ref 0.35–4.50)

## 2016-05-26 LAB — T3, FREE: T3, Free: 2.6 pg/mL (ref 2.3–4.2)

## 2016-10-26 ENCOUNTER — Ambulatory Visit (INDEPENDENT_AMBULATORY_CARE_PROVIDER_SITE_OTHER): Payer: Managed Care, Other (non HMO) | Admitting: Family Medicine

## 2016-10-26 ENCOUNTER — Encounter: Payer: Self-pay | Admitting: Family Medicine

## 2016-10-26 VITALS — BP 114/76 | HR 51 | Temp 98.2°F | Ht 65.0 in | Wt 126.4 lb

## 2016-10-26 DIAGNOSIS — J029 Acute pharyngitis, unspecified: Secondary | ICD-10-CM | POA: Diagnosis not present

## 2016-10-26 LAB — POCT RAPID STREP A (OFFICE): Rapid Strep A Screen: NEGATIVE

## 2016-10-26 NOTE — Patient Instructions (Signed)
I am going to have our team swab you for rapid strep as well as get a culture in case the rapid is negative.   I suspect this is a bad viral pharyngitis/sore throat. I want you to schedule ibuprofen every 6 hours over next 48 hours at least- then can change to as needed. Would do salt water gargle trial as well.   Best thing for you is rest and staying reasonably hydrated- soft foods are a great idea like you are doing. For the kid in you- get some traditional popsicles as they can be soothing.

## 2016-10-26 NOTE — Progress Notes (Signed)
Subjective:  Kristine [name] is a 61 y.o. year old very pleasant female patient who presents for/with See problem oriented charting ROS- no fever or chills. Has left sided sore throat mainly. Minimal cough/congestion. Some drainage in throat.    Past Medical History-  Patient Active Problem List   Diagnosis Date Noted  . Graves disease 01/24/2007    Priority: Medium  . Eczema 08/26/2014    Priority: Low  . Allergic rhinitis 08/26/2014    Priority: Low  . History of colonic polyps 04/23/2010    Priority: Low    Medications- reviewed and updated Current Outpatient Prescriptions  Medication Sig Dispense Refill  . clobetasol (TEMOVATE) 0.05 % external solution Apply 1 application topically as needed.     . diphenhydrAMINE (BENADRYL) 25 MG tablet Take 25 mg by mouth daily.      [name] loratadine (CLARITIN) 10 MG tablet Take 10 mg by mouth daily.    . meloxicam (MOBIC) 15 MG tablet Take 15 mg by mouth daily.    . methimazole (TAPAZOLE) 10 MG tablet TAKE 1/2 TABLET BY MOUTH ONCE DAILY 45 tablet 1  . Multiple Vitamin (MULTIVITAMIN) tablet Take 1 tablet by mouth daily.      . Omega-3 Fatty Acids (FISH OIL) 500 MG CAPS Take by mouth. 1200 mg daily       No current facility-administered medications for this visit.     Objective: BP 114/76 (BP Location: Left Arm, Patient Position: Sitting, Cuff Size: Normal)   Pulse (!) 51   Temp 98.2 F (36.8 C) (Oral)   Ht  (1.651 m)   Wt 126 lb 6.4 oz (57.3 kg)   LMP 03/31/2010   SpO2 96%   BMI 21.03 kg/m  Gen: NAD, appears fatigued Erythematous pharnyx bilaterally. Tonsils surgically absent. TM normal. Nares normal.  CV: RRR no murmurs rubs or gallops Lungs: CTAB no crackles, wheeze, rhonchi Ext: no edema Skin: warm, dry, no rash  Results for orders placed or performed in visit on 10/26/16 (from the past 24 hour(s))  POCT rapid strep A     Status: None   Collection Time: 10/26/16  2:58 PM  Result Value Ref Range   Rapid Strep A Screen  Negative Negative   Assessment/Plan:  Sore throat - Plan: POCT rapid strep A, Culture, Group A Strep  S: sore throat started this morning around midnight. Severe fatigue when driving this morning. Last night- felt like she was severely tired. Took 2 aspirin and painful to swallow, tylenol then did aspirin again. Primarily on the left side. 7/10 pain. No fever with it. Pain with swallowing liquids. Went with yogurt to avoid pain.   39 month old grandbaby in daycare- with him all the time. Some strep has been reported. He had sore throat last week.   A/P: Likely viral pharyngitis. Conservative care per AVS. Get rapid strep- negative. Get strep culture. Discussed mononucleosis possible but dont strongly suspect this given how early in illness.   Orders Placed This Encounter  Procedures  . Culture, Group A Strep    [facility]    Order Specific Question:   Source    Answer:   throat  . POCT rapid strep A    In house    Return precautions advised.  Tana Conch, MD

## 2016-10-28 LAB — CULTURE, GROUP A STREP
MICRO NUMBER: 81036037
SPECIMEN QUALITY: ADEQUATE

## 2017-01-02 ENCOUNTER — Encounter: Payer: Self-pay | Admitting: Family Medicine

## 2017-01-02 ENCOUNTER — Ambulatory Visit: Payer: Managed Care, Other (non HMO) | Admitting: Family Medicine

## 2017-01-02 VITALS — BP 122/68 | HR 55 | Temp 98.3°F | Ht 65.0 in | Wt 127.4 lb

## 2017-01-02 DIAGNOSIS — E0500 Thyrotoxicosis with diffuse goiter without thyrotoxic crisis or storm: Secondary | ICD-10-CM | POA: Diagnosis not present

## 2017-01-02 DIAGNOSIS — J069 Acute upper respiratory infection, unspecified: Secondary | ICD-10-CM | POA: Diagnosis not present

## 2017-01-02 MED ORDER — BENZONATATE 100 MG PO CAPS: 100.0000 mg | ORAL_CAPSULE | Freq: Three times a day (TID) | ORAL | 0 refills | Status: AC | PRN

## 2017-01-02 NOTE — Patient Instructions (Addendum)
Acute upper respiratory infection. Usually lasts 7-14 days so you are halfway there! Great job with hydration and rest- keep that up. You are contagious- good handwashing and coughing into your elbow advised. Could try mucinex to loosen congestion but you are already draining so well so could also skip. Can use tessalon for cough.   If you develop fever, shortness of breath, symptoms over 14 days that are not continuing to improve or drastic worsening in symptoms at this point see us back.   Please stop by lab before you go

## 2017-01-02 NOTE — Assessment & Plan Note (Addendum)
Patient's excess thirst somewhat atypical for URI so will get cbc diff and bmp today- interested in CBG and for signs of dehydration  Since we are drawing these patient requests to take tsh, t3, t4 levels- she is compliant with her methimazole and wants to continue this at 5mg  daily

## 2017-01-02 NOTE — Progress Notes (Signed)
PCP: Shelva MajesticHunter, Stephen O, MD  Subjective:  Kristine [name] is a 61 y.o. year old very pleasant female patient who presents with Upper Respiratory infection symptoms including Headache, sore throat, congestion- worsened and just started improving today after 6 days of symptoms. Cough has been severe- sitting up on couch with 4 pillows- on delsym. Feels like sounds better today, still fatigued. No body aches or fever. No flu shot yet. No shortness of breath other than with coughing fits. No other treatments tried other than tylenol. No sinus pain. Not a big mucinex fan. Clear nasal discharge and sputum. Very thirsty.   Luckily she did not miss thanksgiving festivities as family is planning that upcoming weekend= she was in bed last Wednesday to Friday at least.   ROS-denies fever, SOB, NVD, tooth pain  Pertinent Past Medical History-  Patient Active Problem List   Diagnosis Date Noted  . Graves disease 01/24/2007    Priority: Medium  . Eczema 08/26/2014    Priority: Low  . Allergic rhinitis 08/26/2014    Priority: Low  . History of colonic polyps 04/23/2010    Priority: Low    Medications- reviewed  Current Outpatient Medications  Medication Sig Dispense Refill  . clobetasol (TEMOVATE) 0.05 % external solution Apply 1 application topically as needed.     . diphenhydrAMINE (BENADRYL) 25 MG tablet Take 25 mg by mouth daily.      [name]. loratadine (CLARITIN) 10 MG tablet Take 10 mg by mouth daily.    . meloxicam (MOBIC) 15 MG tablet Take 15 mg by mouth daily.    . methimazole (TAPAZOLE) 10 MG tablet TAKE 1/2 TABLET BY MOUTH ONCE DAILY 45 tablet 1  . Multiple Vitamin (MULTIVITAMIN) tablet Take 1 tablet by mouth daily.      . Omega-3 Fatty Acids (FISH OIL) 500 MG CAPS Take by mouth. 1200 mg daily      . timolol (TIMOPTIC) 0.5 % ophthalmic solution Place 1 drop into both eyes daily.  1   No current facility-administered medications for this visit.     Objective: BP 122/68 (BP Location: Left  Arm, Patient Position: Sitting, Cuff Size: Normal)   Pulse (!) 55   Temp 98.3 F (36.8 C) (Oral)   Ht 5\' 5"  (1.651 m)   Wt 127 lb 6.4 oz (57.8 kg)   LMP 03/31/2010   SpO2 99%   BMI 21.20 kg/m  Gen: NAD, resting comfortably HEENT: Turbinates erythematous, TM normal, pharynx mildly erythematous with no tonsilar exudate or edema, no sinus tenderness CV: RRR no murmurs rubs or gallops Lungs: CTAB no crackles, wheeze, rhonchi Abdomen: soft/nontender/nondistended/normal bowel sounds. No rebound or guarding.  Ext: no edema Skin: warm, dry, no rash Neuro: grossly normal, moves all extremities  Assessment/Plan:  Upper Respiratory infection History and exam today are suggestive of viral infection most likely due to upper respiratory infection. Symptomatic treatment with: tessalon for cough. She does not feel well on codeine including stomach ache and fatigue.   We discussed that we did not find any infection that had higher probability of being bacterial such as pneumonia or strep throat. We discussed signs that bacterial infection may have developed particularly fever or shortness of breath. Likely course of 1-2 weeks. Patient is contagious and advised good handwashing and consideration of mask If going to be in public places.   Finally, we reviewed reasons to return to care including if symptoms worsen or persist or new concerns arise- once again particularly shortness of breath or fever.  Graves disease Patient's excess thirst somewhat atypical for URI so will get cbc diff and bmp today- interested in CBG and for signs of dehydration  Since we are drawing these patient requests to take tsh, t3, t4 levels- she is compliant with her methimazole and wants to continue this at 5mg  daily  Meds ordered this encounter  Medications  . benzonatate (TESSALON) 100 MG capsule    Sig: Take 1 capsule (100 mg total) by mouth 3 (three) times daily as needed for up to 20 days for cough.    Dispense:  30  capsule    Refill:  0    Tana ConchStephen Hunter, MD

## 2017-01-03 ENCOUNTER — Encounter: Payer: Self-pay | Admitting: Family Medicine

## 2017-01-03 LAB — BASIC METABOLIC PANEL
BUN: 13 mg/dL (ref 6–23)
CHLORIDE: 105 meq/L (ref 96–112)
CO2: 29 mEq/L (ref 19–32)
Calcium: 9.5 mg/dL (ref 8.4–10.5)
Creatinine, Ser: 0.73 mg/dL (ref 0.40–1.20)
GFR: 86.73 mL/min (ref >60.00)
GLUCOSE: 93 mg/dL (ref 70–99)
POTASSIUM: 4.1 meq/L (ref 3.5–5.1)
Sodium: 142 mEq/L (ref 135–145)

## 2017-01-03 LAB — CBC WITH DIFFERENTIAL/PLATELET
BASOS ABS: 0.1 10*3/uL (ref 0.0–0.1)
BASOS PCT: 1 % (ref 0.0–3.0)
EOS ABS: 0.1 10*3/uL (ref 0.0–0.7)
Eosinophils Relative: 1.7 % (ref 0.0–5.0)
HEMATOCRIT: 45.2 % (ref 36.0–46.0)
Hemoglobin: 15.4 g/dL — ABNORMAL HIGH (ref 12.0–15.0)
LYMPHS ABS: 2.6 10*3/uL (ref 0.7–4.0)
LYMPHS PCT: 37.6 % (ref 12.0–46.0)
MCHC: 34.1 g/dL (ref 30.0–36.0)
MCV: 93.4 fl (ref 78.0–100.0)
Monocytes Absolute: 0.4 10*3/uL (ref 0.1–1.0)
Monocytes Relative: 6.4 % (ref 3.0–12.0)
NEUTROS ABS: 3.8 10*3/uL (ref 1.4–7.7)
Neutrophils Relative %: 53.3 % (ref 43.0–77.0)
PLATELETS: 219 10*3/uL (ref 150.0–400.0)
RBC: 4.84 Mil/uL (ref 3.87–5.11)
RDW: 12.9 % (ref 11.5–15.5)
WBC: 7 10*3/uL (ref 4.0–10.5)

## 2017-01-03 LAB — TSH: TSH: 0.11 u[IU]/mL — ABNORMAL LOW (ref 0.35–4.50)

## 2017-01-03 LAB — T4, FREE: Free T4: 0.79 ng/dL (ref 0.60–1.60)

## 2017-01-03 LAB — T3, FREE: T3 FREE: 3 pg/mL (ref 2.3–4.2)

## 2017-01-17 ENCOUNTER — Encounter: Payer: Self-pay | Admitting: Family Medicine

## 2017-02-01 ENCOUNTER — Other Ambulatory Visit: Payer: Self-pay

## 2017-02-01 MED ORDER — METHIMAZOLE 10 MG PO TABS: 5.0000 mg | ORAL_TABLET | Freq: Every day | ORAL | 1 refills | Status: DC

## 2017-06-09 ENCOUNTER — Encounter: Payer: Self-pay | Admitting: Family Medicine

## 2017-06-09 LAB — HM MAMMOGRAPHY

## 2017-08-13 ENCOUNTER — Other Ambulatory Visit: Payer: Self-pay | Admitting: Family Medicine

## 2017-09-26 ENCOUNTER — Ambulatory Visit: Payer: Managed Care, Other (non HMO) | Admitting: Family Medicine

## 2017-09-26 ENCOUNTER — Encounter: Payer: Self-pay | Admitting: Family Medicine

## 2017-09-26 VITALS — BP 120/64 | HR 54 | Temp 98.0°F | Ht 65.0 in | Wt 127.2 lb

## 2017-09-26 DIAGNOSIS — R21 Rash and other nonspecific skin eruption: Secondary | ICD-10-CM

## 2017-09-26 DIAGNOSIS — T63461A Toxic effect of venom of wasps, accidental (unintentional), initial encounter: Secondary | ICD-10-CM | POA: Diagnosis not present

## 2017-09-26 NOTE — Progress Notes (Signed)
   Subjective:  Kristine [name] is a 62 y.o. female who presents today for same-day appointment with a chief complaint of rash.   HPI:  Rash, Acute problem Patient was stung by a wasp 5 days ago on her right leg.  He had pain redness and swelling to the area.  The redness has improved some however still have some pain and pruritus to the area.  She has tried Claritin and Benadryl with modest improvement.  Also placed ice as needed which is helped some.  Tried over-the-counter cortisone cream without significant improvement.  No other treatments tried. 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 No reported chest pain, shortness of breath, or face swelling.   ROS: Per HPI  PMH: She reports that she has never smoked. She has never used smokeless tobacco. She reports that she does not drink alcohol or use drugs.  Objective:  Physical Exam: BP 120/64 (BP Location: Left Arm, Patient Position: Sitting, Cuff Size: Normal)   Pulse (!) 54   Temp 98 F (36.7 C) (Oral)   Ht 5\' 5"  (1.651 m)   Wt 127 lb 3.2 oz (57.7 kg)   LMP 03/31/2010   SpO2 94%   BMI 21.17 kg/m   Gen: NAD, resting comfortably Skin: Ecchymoses and petechia noted to right anterior shin.  Rash is approximately 8 cm x 3 cm.  No surrounding erythema.  No raised lesions.  Tender to palpation.  Assessment/Plan:  Rash Secondary to local allergic reaction to her wasp sting.  No signs of infection.  No signs of systemic illness or allergic reaction.  Recommend patient continue using over-the-counter antihistamine medication such as Claritin or Benadryl.  Also recommended her start clobetasol cream to the area twice daily for the next 5 to 7 days to help with itching and redness.  Discussed reasons return to care.  Follow-up as needed.  Katina Degreealeb M. Jimmey RalphParker, MD 09/26/2017 3:16 PM

## 2017-09-26 NOTE — Patient Instructions (Signed)
It was very nice to see you today!  You are having a local allergic reaction to the wasps sting.  Please take over-the-counter antihistamine such as Claritin and Benadryl.  Please use your clobetasol cream twice daily for the next 5 to 7 days.  Please let me know or Dr. Yong Channel know if you have any worsening pain or redness.  Take care, Dr Peter Minium, Wasp, or [place], Adult Bees, wasps, and hornets are part of a family of insects that can sting people. These stings can cause pain and inflammation, but they are usually not serious. However, some people may have an allergic reaction to a sting. This can cause the symptoms to be more severe. What increases the risk? You may be at a greater risk of getting stung if you:  Provoke a stinging insect by swatting or disturbing it.  Wear strong-smelling soaps, deodorants, or body sprays.  Spend time outdoors near gardens with flowers or fruit trees or in clothes that expose skin.  Eat or drink outside.  What are the signs or symptoms? Common symptoms of this condition include:  A red lump in the skin that sometimes has a tiny hole in the center. In some cases, a stinger may be in the center of the wound.  Pain and itching at the sting site.  Redness and swelling around the sting site. If you have an allergic reaction (localized allergic reaction), the swelling and redness may spread out from the sting site. In some cases, this reaction can continue to develop over the next 24-48 hours.  In rare cases, a person may have a severe allergic reaction (anaphylactic reaction) to a sting. Symptoms of an anaphylactic reaction may include:  Wheezing or difficulty breathing.  Raised, itchy, red patches on the skin (hives).  Nausea or vomiting.  Abdominal cramping.  Diarrhea.  Tightness in the chest or chest pain.  Dizziness or fainting.  Redness of the face (flushing).  Hoarse voice.  Swollen tongue, lips, or face.  How is  this diagnosed? This condition is usually diagnosed based on your symptoms and medical history as well as a physical exam. You may have an allergy test to determine if you are allergic to the substance that the insect injected during the sting (venom). How is this treated? If you were stung by a bee, the stinger and a small sac of venom may be in the wound. It is important to remove the stinger as soon as possible. You can do this by brushing across the wound with gauze, a fingernail, or a flat card such as a credit card. Removing the stinger can help reduce the severity of your body's reaction to the sting. Most stings can be treated with:  Icing to reduce swelling in the area.  Medicines (antihistamines) to treat itching or an allergic reaction.  Medicines to help reduce pain. These may be medicines that you take by mouth, or medicated creams or lotions that you apply to your skin.  Pay close attention to your symptoms after you have been stung. If possible, have someone stay with you to make sure you do not have an allergic reaction. If you have any signs of an allergic reaction, call your health care provider. If you have ever had a severe allergic reaction, your health care provider may give you an inhaler or injectable medicine (epinephrine auto-injector) to use if necessary. Follow these instructions at home:  Wash the sting site 2-3 times each day with soap  and water as told by your health care provider.  Apply or take over-the-counter and prescription medicines only as told by your health care provider.  If directed, apply ice to the sting area. ? Put ice in a plastic bag. ? Place a towel between your skin and the bag. ? Leave the ice on for 20 minutes, 2-3 times a day.  Do not scratch the sting area.  If you had a severe allergic reaction to a sting, you may need: ? To wear a medical bracelet or necklace that lists the allergy. ? To learn when and how to use an anaphylaxis kit  or epinephrine injection. Your family members and coworkers may also need to learn this. ? To carry an anaphylaxis kit or epinephrine injection with you at all times. How is this prevented?  Avoid swatting at stinging insects and disturbing insect nests.  Do not use fragrant soaps or lotions.  Wear shoes, pants, and long sleeves when spending time outdoors, especially in grassy areas where stinging insects are common.  Keep outdoor areas free from nests or hives.  Keep food and drink containers covered when eating outdoors.  Avoid working or sitting near [employer], if possible.  Wear gloves if you are gardening or working outdoors.  If an attack by a stinging insect or a swarm seems likely in the moment, move away from the area or find a barrier between you and the insect(s), such as a door. Contact a health care provider if:  Your symptoms do not get better in 2-3 days.  You have redness, swelling, or pain that spreads beyond the area of the sting.  You have a fever. Get help right away if: You have symptoms of a severe allergic reaction. These include:  Wheezing or difficulty breathing.  Tightness in the chest or chest pain.  Light-headedness or fainting.  Itchy, raised, red patches on the skin.  Nausea or vomiting.  Abdominal cramping.  Diarrhea.  A swollen tongue or lips, or trouble swallowing.  Dizziness or fainting.  Summary  Stings from bees, wasps, and hornets can cause pain and inflammation, but they are usually not serious. However, some people may have an allergic reaction to a sting. This can cause the symptoms to be more severe.  Pay close attention to your symptoms after you have been stung. If possible, have someone stay with you to make sure you do not have an allergic reaction.  Call your health care provider if you have any signs of an allergic reaction. This information is not intended to replace advice given to you by your health care  provider. Make sure you discuss any questions you have with your health care provider. Document Released: 01/24/2005 Document Revised: 03/31/2016 Document Reviewed: 03/31/2016 Elsevier Interactive Patient Education  [employer].

## 2018-01-26 ENCOUNTER — Other Ambulatory Visit: Payer: Self-pay | Admitting: Family Medicine

## 2018-02-26 ENCOUNTER — Ambulatory Visit: Payer: Managed Care, Other (non HMO) | Admitting: Family Medicine

## 2018-02-26 ENCOUNTER — Encounter: Payer: Self-pay | Admitting: Family Medicine

## 2018-02-26 VITALS — BP 112/76 | HR 49 | Temp 97.3°F | Ht 65.0 in | Wt 125.4 lb

## 2018-02-26 DIAGNOSIS — E785 Hyperlipidemia, unspecified: Secondary | ICD-10-CM

## 2018-02-26 DIAGNOSIS — M65341 Trigger finger, right ring finger: Secondary | ICD-10-CM

## 2018-02-26 DIAGNOSIS — Z0000 Encounter for general adult medical examination without abnormal findings: Secondary | ICD-10-CM | POA: Diagnosis not present

## 2018-02-26 DIAGNOSIS — Z23 Encounter for immunization: Secondary | ICD-10-CM | POA: Diagnosis not present

## 2018-02-26 DIAGNOSIS — E0500 Thyrotoxicosis with diffuse goiter without thyrotoxic crisis or storm: Secondary | ICD-10-CM

## 2018-02-26 NOTE — Patient Instructions (Addendum)
Please stop by lab before you go  Shingrix #1 today. Repeat injection in 2-5 months. Schedule a nurse visit for the 2nd injection before you leave today (at the check out desk)  Can also schedule a physical before you go for one year   Please schedule a visit with our excellent sports medicine physician Dr. Berline Chough before you leave at the check out desk so he can further evaluate your right ring trigger finger   No other changes today

## 2018-02-26 NOTE — Progress Notes (Signed)
Phone: [phone]  Subjective:  Patient presents today for their annual physical. Chief complaint-noted.   See problem oriented charting- ROS- full  review of systems was completed and negative except for some pain/triggering of right ring finger  The following were reviewed and entered/updated in epic: Past Medical History:  Diagnosis Date  . Allergy   . GRAVE'S DISEASE 01/24/2007   Patient Active Problem List   Diagnosis Date Noted  . Graves disease 01/24/2007    Priority: Medium  . Eczema 08/26/2014    Priority: Low  . Allergic rhinitis 08/26/2014    Priority: Low  . History of colonic polyps 04/23/2010    Priority: Low   Past Surgical History:  Procedure Laterality Date  . CARPAL TUNNEL RELEASE     3-4 yeras ago from 2016  . COLONOSCOPY  2010, 2016   polyp in 2010, none 2016  . 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 TONSILLECTOMY  1972    Family History  Problem Relation Age of Onset  . Heart disease Father        7574 MI- 4 stents incl. LAD, former smoker distant  . Lung disease Mother        "shock lung"  . Colon cancer Neg Hx   . Colon polyps Neg Hx   . Rectal cancer Neg Hx   . Stomach cancer Neg Hx   . Esophageal cancer Neg Hx     Medications- reviewed and updated Current Outpatient Medications  Medication Sig Dispense Refill  . brimonidine (ALPHAGAN) 0.15 % ophthalmic solution Place 1 drop into both eyes daily.     . clobetasol (TEMOVATE) 0.05 % external solution Apply 1 application topically as needed.     . diphenhydrAMINE (BENADRYL) 25 MG tablet Take 25 mg by mouth daily.      . dorzolamide-timolol (COSOPT) 22.3-6.8 MG/ML ophthalmic solution Place 1 drop into both eyes daily.    [name]. loratadine (CLARITIN) 10 MG tablet Take 10 mg by mouth daily.    . methimazole (TAPAZOLE) 10 MG tablet TAKE 1/2 TABLET BY MOUTH EVERY DAY 45 tablet 1  . Misc Natural Products (TURMERIC CURCUMIN) CAPS Take 1 capsule by mouth daily.    . Multiple Vitamin  (MULTIVITAMIN) tablet Take 1 tablet by mouth daily.      . Omega-3 Fatty Acids (FISH OIL) 500 MG CAPS Take by mouth. 1200 mg daily       No current facility-administered medications for this visit.     Allergies-reviewed and updated No Known Allergies  Social History   Social History Narrative   Married. 927 and 61 year old daughters in [id number]. 7816 month old grandkid 10/2016.    Younger daughter married in 2019- and will be moving close to her. 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       Hobbies: read, walk dog, yoga, movies, steelers in the fall    Objective: BP 112/76 (BP Location: Left Arm, Patient Position: Sitting, Cuff Size: Large)   Pulse (!) 49   Temp (!) 97.3 F (36.3 C) (Oral)   Ht 5\' 5"  (1.651 m)   Wt 125 lb 6.4 oz (56.9 kg)   LMP 03/31/2010   SpO2 97%   BMI 20.87 kg/m  Gen: NAD, resting comfortably HEENT: Mucous membranes are moist. Oropharynx normal Neck: no thyromegaly CV: RRR no murmurs rubs or gallops Lungs: CTAB  no crackles, wheeze, rhonchi Abdomen: soft/nontender/nondistended/normal bowel sounds. No rebound or guarding.  Ext: no edema Skin: warm, dry Neuro: grossly normal, moves all extremities, PERRLA  Assessment/Plan:  63 y.o. female presenting for annual physical.  Health Maintenance counseling: 1. Anticipatory guidance: Patient counseled regarding regular dental exams -q6 months, eye exams - q4 months due to glaucoma,  avoiding smoking and second hand smoke, limiting alcohol to 1 beverage per day- doesn't drink .   2. Risk factor reduction:  Advised patient of need for regular exercise and diet rich and fruits and vegetables to reduce risk of heart attack and stroke. Exercise- 6 days a week. Diet-reasonably healthy plant based diet.  Wt Readings from Last 3 Encounters:  02/26/18 125 lb 6.4 oz (56.9 kg)  09/26/17 127 lb 3.2 oz (57.7 kg)    01/02/17 127 lb 6.4 oz (57.8 kg)  3. Immunizations/screenings/ancillary studies- up to date. Will come back for next shingrix Immunization History  Administered Date(s) Administered  . Influenza Whole 02/12/2009, 10/13/2009  . Influenza,inj,Quad PF,6+ Mos 01/30/2013, 12/26/2013  . Influenza-Unspecified 03/26/2011, 12/06/2015, 02/04/2017, 11/04/2017  . Td 09/09/2009  . Tdap 02/09/2016  . Zoster Recombinat (Shingrix) 02/26/2018  4. Cervical cancer screening- last 2017- asked her to send [place] copy of next pap- she may actually be on 5 year regimen 5. Breast cancer screening-  breast exam with gynecology and mammogram 06/09/17 6. Colon cancer screening - 08/15/14 with 10 year follow up. Confirmed by last report 7. Skin cancer screening- Dr. Sharyn Lull. advised regular sunscreen use. Denies worrisome, changing, or new skin lesions.  8. Birth control/STD check- postmenopausal/monogomous 9. Osteoporosis screening at 48- early screening 02/26/16 with normal bone density 10. never smoker  Status of chronic or acute concerns   Right ring finger bothering her- right handed- doing an advil every night and tylenol in the morning. Would like to see Dr. Berline Chough to consider injection- refer today  Glaucoma- follows with optho regularly q4 months  Has continued on methimazole 5 mg daily still- will check tsh,t3,t4. She does not want definitive treatment.   Hyperlipidemia- very mild in the past- will update lipids  Return in about 1 year (around 02/27/2019) for physical.  Lab/Order associations: clementine and 4 peanuts today, water to drink.    Preventative health care - Plan: CBC, Comprehensive metabolic panel, Lipid panel, TSH, T3, free, T4, free  Trigger ring finger of right hand - Plan: Ambulatory referral to Sports Medicine  Need for prophylactic vaccination and inoculation against varicella - Plan: Varicella-zoster vaccine IM  Graves disease - Plan: TSH, T3, free, T4, free  Hyperlipidemia,  unspecified hyperlipidemia type - Plan: CBC, Comprehensive metabolic panel, Lipid panel  Return precautions advised.  Tana Conch, MD

## 2018-02-27 LAB — LIPID PANEL
Cholesterol: 185 mg/dL (ref 0–200)
HDL: 63.3 mg/dL (ref >39.00)
LDL Cholesterol: 111 mg/dL — ABNORMAL HIGH (ref 0–99)
NONHDL: 121.94
Total CHOL/HDL Ratio: 3
Triglycerides: 53 mg/dL (ref 0.0–149.0)
VLDL: 10.6 mg/dL (ref 0.0–40.0)

## 2018-02-27 LAB — COMPREHENSIVE METABOLIC PANEL
ALK PHOS: 73 U/L (ref 39–117)
ALT: 17 U/L (ref 0–35)
AST: 26 U/L (ref 0–37)
Albumin: 4.5 g/dL (ref 3.5–5.2)
BUN: 13 mg/dL (ref 6–23)
CO2: 28 mEq/L (ref 19–32)
Calcium: 9.8 mg/dL (ref 8.4–10.5)
Chloride: 104 mEq/L (ref 96–112)
Creatinine, Ser: 0.79 mg/dL (ref 0.40–1.20)
GFR: 74.2 mL/min (ref >60.00)
GLUCOSE: 79 mg/dL (ref 70–99)
POTASSIUM: 4.3 meq/L (ref 3.5–5.1)
Sodium: 140 mEq/L (ref 135–145)
TOTAL PROTEIN: 6.8 g/dL (ref 6.0–8.3)
Total Bilirubin: 0.7 mg/dL (ref 0.2–1.2)

## 2018-02-27 LAB — CBC
HEMATOCRIT: 43 % (ref 36.0–46.0)
Hemoglobin: 14.7 g/dL (ref 12.0–15.0)
MCHC: 34.3 g/dL (ref 30.0–36.0)
MCV: 94.4 fl (ref 78.0–100.0)
Platelets: 227 10*3/uL (ref 150.0–400.0)
RBC: 4.56 Mil/uL (ref 3.87–5.11)
RDW: 12.5 % (ref 11.5–15.5)
WBC: 6 10*3/uL (ref 4.0–10.5)

## 2018-02-27 LAB — TSH: TSH: 0.75 u[IU]/mL (ref 0.35–4.50)

## 2018-02-27 LAB — T3, FREE: T3, Free: 2.8 pg/mL (ref 2.3–4.2)

## 2018-02-27 LAB — T4, FREE: Free T4: 0.82 ng/dL (ref 0.60–1.60)

## 2018-03-02 ENCOUNTER — Ambulatory Visit: Payer: Managed Care, Other (non HMO) | Admitting: Sports Medicine

## 2018-03-02 DIAGNOSIS — Z0289 Encounter for other administrative examinations: Secondary | ICD-10-CM

## 2018-03-06 ENCOUNTER — Ambulatory Visit: Payer: Managed Care, Other (non HMO) | Admitting: Sports Medicine

## 2018-03-06 ENCOUNTER — Ambulatory Visit: Payer: Self-pay

## 2018-03-06 ENCOUNTER — Encounter: Payer: Self-pay | Admitting: Sports Medicine

## 2018-03-06 VITALS — BP 110/62 | HR 53 | Ht 65.0 in | Wt 125.0 lb

## 2018-03-06 DIAGNOSIS — M65341 Trigger finger, right ring finger: Secondary | ICD-10-CM

## 2018-03-06 NOTE — Progress Notes (Signed)
Kristine [name]. Kristine [name] [facility] at [facility] [phone]  Kristine [name] - 64 y.o. female MRN [id number]  Date of birth: 1955/07/24  Visit Date: March 11, 2018  PCP: [name], MD   Referred by: [name], MD  SUBJECTIVE:   Chief Complaint  Patient presents with  . New Patient (Initial Visit)    R ring finger pain.  Referred by [facility].   HPI: Right ring finger is been worsening over the past several months.  She reports it does get stuck in full flexion.  Pain radiates into the entire right ring finger.  Is rated as moderate.  There is some swelling.  She has pain and weakness when this is flared up.  Gripping and holding things makes this worse.  It is helped with heat, hot yoga, medications and resting with her fingers fully extended such as underneath her pillow.  She has tried taking Tylenol in the morning and ibuprofen at night with some improvement.  CBD has been beneficial.  REVIEW OF SYSTEMS: This does occasionally awaken her at night but is definitely worse first thing in the morning.   Otherwise 12 point review of systems performed and is negative   HISTORY:  Prior history reviewed and updated per electronic medical record.  Social History   Occupational History  . Not on file  Tobacco Use  . Smoking status: Never Smoker  . Smokeless tobacco: Never Used  Substance and Sexual Activity  . Alcohol use: No    Alcohol/week: 0.0 standard drinks  . Drug use: No  . Sexual activity: Not on file   Social History   Social History Narrative   Married. 29 and 48 year old daughters in [id number]. 66 month old grandkid 10/2016.    Younger daughter married in 2019- and will be moving close to her. 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       Hobbies: read, walk dog, yoga, movies, steelers in the fall   Past  Medical History:  Diagnosis Date  . Allergy   . GRAVE'S DISEASE 01/24/2007   Past Surgical History:  Procedure Laterality Date  . CARPAL TUNNEL RELEASE     3-4 yeras ago from 2016  . COLONOSCOPY  2010, 2016   polyp in 2010, none 2016  . 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 TONSILLECTOMY  1972   family history includes Heart disease in her father; Lung disease in her mother. There is no history of Colon cancer, Colon polyps, Rectal cancer, Stomach cancer, or Esophageal cancer. Recent Labs    02/26/18 1502  CREATININE 0.79  CALCIUM 9.8  AST 26  ALT 17  TSH 0.75    OBJECTIVE:  VS:  HT:5\' 5"  (165.1 cm)   WT:125 lb (56.7 kg)  BMI:20.8    BP:110/62  HR:(!) 53bpm  TEMP: ( )  RESP:98 %   PHYSICAL EXAM: CONSTITUTIONAL: Well-developed, Well-nourished and In no acute distress EYES: Pupils are equal., EOM intact without nystagmus. and No scleral icterus. Psychiatric: Alert & appropriately interactive. and Not depressed or anxious appearing. EXTREMITY EXAM: Warm and well perfused  Right hand: Overall well aligned.  She has a extensor lag of the right ring finger.  She has triggering with appreciable palpable defect in full flexion.  Grip strength is slightly diminished.  Her hands  are ligamentously stable.  Minimal osteoarthritic bossing.   ASSESSMENT   1. Trigger ring finger of right hand     PROCEDURES:  US Guided Injection per procedure note      PLAN:  Pertinent additional documentation may be included in corresponding procedure notes, imaging studies, problem based documentation and patient instructions.  No problem-specific Assessment & Plan notes found for this encounter.  Discussed the path of etiology of the underlying trigger finger response and ultimately elected for ultrasound-guided injection.  Activity modifications and the importance of avoiding exacerbating activities (limiting pain to no more than a 4 / 10 during or following activity) recommended  and discussed.  Discussed red flag symptoms that warrant earlier emergent evaluation and patient voices understanding.  At follow up will plan to consider: repeat MSK Ultrasound   Return in about 6 weeks (around 04/17/2018).          Andrena MewsMichael D Rigby, DO    [facility] Physician

## 2018-03-06 NOTE — Procedures (Signed)
PROCEDURE NOTE:  Ultrasound Guided: Injection: Right Fourth finger/ring finger, Tendon Sheath Images were obtained and interpreted by myself, Gaspar Bidding, DO  Images have been saved and stored to PACS system. Images obtained on: GE S7 Ultrasound machine    ULTRASOUND FINDINGS:  Poor tendon glide with hypoechoic change at the A1 pulley.  DESCRIPTION OF PROCEDURE:  The patient's clinical condition is marked by substantial pain and/or significant functional disability. Other conservative therapy has not provided relief, is contraindicated, or not appropriate. There is a reasonable likelihood that injection will significantly improve the patient's pain and/or functional impairment.   After discussing the risks, benefits and expected outcomes of the injection and all questions were reviewed and answered, the patient wished to undergo the above named procedure.  Verbal consent was obtained.  The ultrasound was used to identify the target structure and adjacent neurovascular structures. The skin was then prepped in sterile fashion and the target structure was injected under direct visualization using sterile technique as below:  Single injection performed as below: PREP: Alcohol, Ethel Chloride and 1 cc 1% lidocaine on Insulin Needle APPROACH:direct, single injection, 25g 1.5 in. INJECTATE: 0.5 cc 1% lidocaine, 0.5 cc 0.5% Marcaine and 0.5 cc 40mg /mL DepoMedrol ASPIRATE: None DRESSING: Band-Aid  Post procedural instructions including recommending icing and warning signs for infection were reviewed.    This procedure was well tolerated and there were no complications.   IMPRESSION: Succesful Ultrasound Guided: Injection

## 2018-03-06 NOTE — Patient Instructions (Signed)

## 2018-04-16 ENCOUNTER — Ambulatory Visit: Payer: Managed Care, Other (non HMO) | Admitting: Sports Medicine

## 2018-04-16 ENCOUNTER — Encounter: Payer: Self-pay | Admitting: Sports Medicine

## 2018-04-16 VITALS — BP 102/74 | HR 48 | Ht 65.0 in | Wt 125.6 lb

## 2018-04-16 DIAGNOSIS — M65341 Trigger finger, right ring finger: Secondary | ICD-10-CM | POA: Diagnosis not present

## 2018-04-16 MED ORDER — DICLOFENAC SODIUM 2 % TD SOLN: 1.0000 "application " | Freq: Two times a day (BID) | TRANSDERMAL | 2 refills | Status: DC

## 2018-04-16 MED ORDER — DICLOFENAC SODIUM 2 % TD SOLN: 1.0000 "application " | Freq: Two times a day (BID) | TRANSDERMAL | 0 refills | Status: AC

## 2018-04-16 NOTE — Progress Notes (Signed)
Kristine [name]. Kristine [name] [facility] Medicine [facility] at [facility] [phone]  [name] - 63 y.o. female MRN [id number]  Date of birth: [birth year]  Visit Date: April 19, 2018  PCP: [name], MD   Referred by: [name], MD  SUBJECTIVE:  Chief Complaint  Patient presents with  . Right Ring Finger - Follow-up    Corticosteroid injection 03/06/18. Has tried Tylenol, IBU, CBD, and heat.     HPI: Patient reports overall she is doing quite well and has only minimal symptoms at this time.  She occasionally does have minimal triggering still with her finger but this is not painful at all.  She does not have to use her contralateral hand to help with this.  Does seem to be forcing in the morning and does resolve.  REVIEW OF SYSTEMS: No significant nighttime awakenings due to this issue. Denies fevers, chills, recent weight gain or weight loss.  No night sweats.  Pt denies any change in bowel or bladder habits, muscle weakness, numbness or falls associated with this pain.  HISTORY:  Prior history reviewed and updated per electronic medical record.  Patient Active Problem List   Diagnosis Date Noted  . Eczema 08/26/2014    Clobetasol prn through derm, mixes with cerave   . Allergic rhinitis 08/26/2014    claritin or allegra in AM, Benadryl prn night   . History of colonic polyps 04/23/2010    No polyps 08/2014- returned to 10 year follow up    . Graves disease 01/24/2007    Diagnosed in 2007 a thyroid uptake scan. Stable on methimazole for years. Has not yet returned out.     Social History   Occupational History  . Not on file  Tobacco Use  . Smoking status: Never Smoker  . Smokeless tobacco: Never Used  Substance and Sexual Activity  . Alcohol use: No    Alcohol/week: 0.0 standard drinks  . Drug use: No  . Sexual activity: Not on file   Social History   Social History Narrative   Married. 36 and 46 year old daughters in  [id number]. 71 month old grandkid 10/2016.    Younger daughter married in 2019- and will be moving close to her. 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       Hobbies: read, walk dog, yoga, movies, steelers in the fall    OBJECTIVE:  VS:  HT:5\' 5"  (165.1 cm)   WT:125 lb 9.6 oz (57 kg)  BMI:20.9    BP:102/74  HR:(!) 48bpm  TEMP: ( )  RESP:96 %   PHYSICAL EXAM: Adult female. No acute distress.  Alert and appropriate. Right ring finger has good flexion extension of the finger at the MCP, and IP joints.  She has intact strength.  There is a small nodularity over the common flexor tendon this is minimal.  She has no pain with this.  There is a slight triggering can be appreciated with direct palpation and passive range of motion.   ASSESSMENT:   1. Trigger ring finger of right hand     PROCEDURES:  None  PLAN:  Pertinent additional documentation may be included in corresponding procedure notes, imaging studies, problem based documentation and patient instructions.  No problem-specific Assessment & Plan notes found for this encounter.   Patient does have some persistent nodularity  of her flexor tendon however she has no significant pain and no focal triggering.  Given the persistent prominence however would like for her to try 2 weeks of Pennsaid to see if this can be additionally beneficial and may discontinue it after this.     Activity modifications and the importance of avoiding exacerbating activities (limiting pain to no more than a 4 / 10 during or following activity) recommended and discussed.    Meds ordered this encounter  Medications  . Diclofenac Sodium (PENNSAID) 2 % SOLN    Sig: Place 1 application onto the skin 2 (two) times daily for 1 day.    Dispense:  8 g    Refill:  0  . Diclofenac Sodium (PENNSAID) 2 % SOLN    Sig: Place 1 application onto the skin  2 (two) times daily.    Dispense:  112 g    Refill:  2    Home Phone      [phone] Work Phone      [phone] Mobile          [phone]    Lab Orders  No laboratory test(s) ordered today   Imaging Orders  No imaging studies ordered today   Referral Orders  No referral(s) requested today    Return if symptoms worsen or fail to improve.          Andrena Mews, DO    [facility] Physician

## 2018-04-16 NOTE — Patient Instructions (Addendum)
Pennsaid instructions: You have been given a sample/prescription for Pennsaid, a topical medication.     You are to apply this gel to your injured body part twice daily (morning and evening).   A little goes a long way so you can use about a pea-sized amount for each area.   Spread this small amount over the area into a thin film and let it dry.   Be sure that you do not rub the gel into your skin for more than 10 or 15 seconds otherwise it can irritate you skin.    Once you apply the gel, please do not put any other lotion or clothing in contact with that area for 30 minutes to allow the gel to absorb into your skin.   Some people are sensitive to the medication and can develop a sunburn-like rash.  If you have only mild symptoms it is okay to continue to use the medication but if you have any breakdown of your skin you should discontinue its use and please let us know.   If you have been written a prescription for Pennsaid, you will receive a pump bottle of this topical gel through a mail order pharmacy.  The instructions on the bottle will say to apply two pumps twice a day which may be too much gel for your particular area so use the pea-sized amount as your guide.  Instructions for Duexis, Pennsaid and Vimovo:  Your prescription will be filled through a participating [facility] mail order pharmacy.  You will receive a phone call or text from one of the participating pharmacies which can be located in any state in the [place].  You must communicate directly with them to have this medication filled.  When the pharmacy contacts you, they will need your mailing address (for shipment of the medication) andy they will need payment information if you have a copay (typically no more than $10). If you have not heard from them 2-3 days after your appointment with Dr. Rigby, contact [facility] directly at [phone].  

## 2018-04-19 ENCOUNTER — Encounter: Payer: Self-pay | Admitting: Sports Medicine

## 2018-05-01 ENCOUNTER — Ambulatory Visit (INDEPENDENT_AMBULATORY_CARE_PROVIDER_SITE_OTHER): Payer: Managed Care, Other (non HMO)

## 2018-05-01 ENCOUNTER — Other Ambulatory Visit: Payer: Self-pay

## 2018-05-01 DIAGNOSIS — Z23 Encounter for immunization: Secondary | ICD-10-CM | POA: Diagnosis not present

## 2018-05-01 NOTE — Progress Notes (Signed)
Per orders of Dr. Durene Cal, injection of Shingrix (#2) given by Alessandra Bevels Zellmer, CMA in left deltoid.  Patient tolerated injection well.

## 2018-07-24 ENCOUNTER — Other Ambulatory Visit: Payer: Self-pay | Admitting: Family Medicine

## 2018-09-17 LAB — HM MAMMOGRAPHY

## 2018-09-20 ENCOUNTER — Encounter: Payer: Self-pay | Admitting: Family Medicine

## 2018-10-19 ENCOUNTER — Other Ambulatory Visit: Payer: Self-pay | Admitting: Family Medicine

## 2018-11-26 ENCOUNTER — Ambulatory Visit (INDEPENDENT_AMBULATORY_CARE_PROVIDER_SITE_OTHER): Payer: Managed Care, Other (non HMO) | Admitting: Family Medicine

## 2018-11-26 ENCOUNTER — Ambulatory Visit: Payer: Self-pay

## 2018-11-26 ENCOUNTER — Encounter: Payer: Self-pay | Admitting: Family Medicine

## 2018-11-26 ENCOUNTER — Other Ambulatory Visit: Payer: Self-pay

## 2018-11-26 VITALS — BP 108/74 | HR 52 | Ht 65.0 in | Wt 128.0 lb

## 2018-11-26 DIAGNOSIS — M65341 Trigger finger, right ring finger: Secondary | ICD-10-CM | POA: Insufficient documentation

## 2018-11-26 NOTE — Assessment & Plan Note (Signed)
Patient given injection today.  Tolerated the procedure well.  Had more of a superficial cyst that is contributing.  Discussed bracing at night, buddy taping provided activity, follow-up again in 6 to 8 weeks if not perfect.  Otherwise follow-up as needed

## 2018-11-26 NOTE — Progress Notes (Signed)
Tawana ScaleZach Sheronda Parran D.O. [facility] [address] [place], [place] [address] Phone: [phone] Subjective:     CC: right hand  BJY:NWGNFAOZHYHPI:Subjective   03/06/2018 Injection from Dr. Berline Choughigby  Update 11/26/2018 Sherrilyn Ristleanor S Angelino is a 63 y.o. female coming in with complaint of right hand, ring finger trigger finger. Patient states that she is not having as much triggering but is having a lot of pain. Does cycle and gripping handles bars causes pain afterwards. Pain increases when she is sleeping. Pain has increased over past month. Has been using Pennsaid prn.  Patient states it was doing much better but now is having worsening pain again.  Calls it a dull, throbbing aching pain.  Not having as much of the triggering as she did previously.  Patient denies any swelling, no new activity but has been working more at home.  Patient has been trying to change some of the modalities or stretch it with some mild improvement.     Past Medical History:  Diagnosis Date  . Allergy   . GRAVE'S DISEASE 01/24/2007   Past Surgical History:  Procedure Laterality Date  . CARPAL TUNNEL RELEASE     3-4 yeras ago from 2016  . COLONOSCOPY  2010, 2016   polyp in 2010, none 2016  . 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 TONSILLECTOMY  1972   Social History   Socioeconomic History  . Marital status: Married    Spouse name: Not on file  . Number of children: Not on file  . Years of education: Not on file  . Highest education level: Not on file  Occupational History  . Not on file  Social Needs  . Financial resource strain: Not on file  . Food insecurity    Worry: Not on file    Inability: Not on file  . Transportation needs    Medical: Not on file    Non-medical: Not on file  Tobacco Use  . Smoking status: Never Smoker  . Smokeless tobacco: Never Used  Substance and Sexual Activity  . Alcohol use: No    Alcohol/week: 0.0 standard drinks  . Drug use: No  . Sexual activity: Not on file   Lifestyle  . Physical activity    Days per week: Not on file    Minutes per session: Not on file  . Stress: Not on file  Relationships  . Social Musicianconnections    Talks on phone: Not on file    Gets together: Not on file    Attends religious service: Not on file    Active member of club or organization: Not on file    Attends meetings of clubs or organizations: Not on file    Relationship status: Not on file  Other Topics Concern  . Not on file  Social History Narrative   Married. 2527 and 61 year old daughters in [id number]. 2016 month old grandkid 10/2016.    Younger daughter married in 2019- and will be moving close to her. 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       Hobbies: read, walk dog, yoga, movies, steelers in the fall   No Known Allergies Family History  Problem Relation Age of Onset  . Heart disease Father        6076 MI- 4 stents incl. LAD, former smoker distant  . Lung  disease Mother        "shock lung"  . Colon cancer Neg Hx   . Colon polyps Neg Hx   . Rectal cancer Neg Hx   . Stomach cancer Neg Hx   . Esophageal cancer Neg Hx     Current Outpatient Medications (Endocrine & Metabolic):  .  methimazole (TAPAZOLE) 10 MG tablet, TAKE 1/2 TABLET BY MOUTH EVERY DAY   Current Outpatient Medications (Respiratory):  .  diphenhydrAMINE (BENADRYL) 25 MG tablet, Take 25 mg by mouth daily.   [name]  loratadine (CLARITIN) 10 MG tablet, Take 10 mg by mouth daily.    Current Outpatient Medications (Other):  .  clobetasol (TEMOVATE) 0.05 % external solution, Apply 1 application topically as needed.  .  Diclofenac Sodium (PENNSAID) 2 % SOLN, Place 1 application onto the skin 2 (two) times daily. .  dorzolamide-timolol (COSOPT) 22.3-6.8 MG/ML ophthalmic solution, Place 1 drop into both eyes 2 (two) times daily.  .  Misc Natural Products (TURMERIC CURCUMIN) CAPS, Take 1 capsule  by mouth daily. .  Multiple Vitamin (MULTIVITAMIN) tablet, Take 1 tablet by mouth daily.   .  Omega-3 Fatty Acids (FISH OIL) 500 MG CAPS, Take by mouth. 1200 mg daily      Past medical history, social, surgical and family history all reviewed in electronic medical record.  No pertanent information unless stated regarding to the chief complaint.   Review of Systems:  No headache, visual changes, nausea, vomiting, diarrhea, constipation, dizziness, abdominal pain, skin rash, fevers, chills, night sweats, weight loss, swollen lymph nodes, body aches, joint swelling, chest pain, shortness of breath, mood changes.  Positive muscle aches  Objective  Blood pressure 108/74, pulse (!) 52, height 5\' 5"  (1.651 m), weight 128 lb (58.1 kg), last menstrual period 03/31/2010, SpO2 93 %.    General: No apparent distress alert and oriented x3 mood and affect normal, dressed appropriately.  HEENT: Pupils equal, extraocular movements intact  Respiratory: Patient's speak in full sentences and does not appear short of breath  Cardiovascular: No lower extremity edema, non tender, no erythema  Skin: Warm dry intact with no signs of infection or rash on extremities or on axial skeleton.  Abdomen: Soft nontender  Neuro: Cranial nerves II through XII are intact, neurovascularly intact in all extremities with 2+ DTRs and 2+ pulses.  Lymph: No lymphadenopathy of posterior or anterior cervical chain or axillae bilaterally.  Gait antalgic MSK:  Non tender with full range of motion and good stability and symmetric strength and tone of shoulders, elbows, wrist, hip, knee and ankles bilaterally.  Hand exam though shows the patient does have a trigger nodule on the right ring finger.  No true triggering noted though today.  Tender to palpation in the A2 pulley  Procedure: Real-time Ultrasound Guided Injection of right ring finger flexor tendon sheath Device: GE Logiq Q7 Ultrasound guided injection is preferred based  studies that show increased duration, increased effect, greater accuracy, decreased procedural pain, increased response rate, and decreased cost with ultrasound guided versus blind injection.  Verbal informed consent obtained.  Time-out conducted.  Noted no overlying erythema, induration, or other signs of local infection.  Skin prepped in a sterile fashion.  Local anesthesia: Topical Ethyl chloride.  With sterile technique and under real time ultrasound guidance: With a 25-gauge half inch needle injected with 0.5 cc of 0.5% Marcaine and 0.5 cc of Kenalog 40 mg/mL Completed without difficulty  Pain immediately resolved suggesting accurate placement of the medication.  Advised to  call if fevers/chills, erythema, induration, drainage, or persistent bleeding.  Images permanently stored and available for review in the ultrasound unit.  Impression: Technically successful ultrasound guided injection.   Impression and Recommendations:     This case required medical decision making of moderate complexity. The above documentation has been reviewed and is accurate and complete Judi Saa, DO       Note: This dictation was prepared with Dragon dictation along with smaller phrase technology. Any transcriptional errors that result from this process are unintentional.

## 2018-11-26 NOTE — Patient Instructions (Addendum)
Wear brace today and then at night for 2 weeks Injected finger today Can buddy tape if needed during the way Continue to monitor and see me for 6 weeks if not perfect

## 2019-01-08 ENCOUNTER — Ambulatory Visit (INDEPENDENT_AMBULATORY_CARE_PROVIDER_SITE_OTHER): Payer: Managed Care, Other (non HMO) | Admitting: Family Medicine

## 2019-01-08 ENCOUNTER — Encounter: Payer: Self-pay | Admitting: Family Medicine

## 2019-01-08 ENCOUNTER — Other Ambulatory Visit: Payer: Self-pay

## 2019-01-08 DIAGNOSIS — M65341 Trigger finger, right ring finger: Secondary | ICD-10-CM | POA: Diagnosis not present

## 2019-01-08 NOTE — Assessment & Plan Note (Addendum)
Patient is doing very well after the injection.  Having no significant pain at the moment.  Discussed icing regimen and home exercises, discussed which activities to doing which wants to avoid.  Patient is to increase activity as tolerated.  Follow-up PRN

## 2019-01-08 NOTE — Progress Notes (Signed)
[name] [place], [place] [address] Phone: [phone] Subjective:   Kristine [name], am serving as a scribe for Dr. Hulan Saas.  This visit occurred during the SARS-CoV-2 public health emergency.  Safety protocols were in place, including screening questions prior to the visit, additional usage of staff PPE, and extensive cleaning of exam room while observing appropriate contact time as indicated for disinfecting solutions.    CC: Trigger finger follow-up  BJY:NWGNFAOZHY   10/19/202 Patient given injection today.  Tolerated the procedure well.  Had more of a superficial cyst that is contributing.  Discussed bracing at night, buddy taping provided activity, follow-up again in 6 to 8 weeks if not perfect.  Otherwise follow-up as needed  Update 01/08/2019 Kristine [name] is a 63 y.o. female coming in with complaint of right hand trigger finger. Has felt improvement within past week. Has not had locking for past 10 days. After injection had more locking for 3 weeks. Is able to wear her rings now due to decrease in swelling. Did wear brace at night.      Past Medical History:  Diagnosis Date  . Allergy   . [place] DISEASE 01/24/2007   Past Surgical History:  Procedure Laterality Date  . CARPAL TUNNEL RELEASE     3-4 yeras ago from 2016  . COLONOSCOPY  2010, 2016   polyp in 2010, none 2016  . THORACOTOMY  8657   duplicate esophagus  . TONSILLECTOMY  1972   Social History   Socioeconomic History  . Marital status: Married    Spouse name: Not on file  . Number of children: Not on file  . Years of education: Not on file  . Highest education level: Not on file  Occupational History  . Not on file  Social Needs  . Financial resource strain: Not on file  . Food insecurity    Worry: Not on file    Inability: Not on file  . Transportation needs    Medical: Not on file    Non-medical: Not on file  Tobacco Use  . Smoking status:  Never Smoker  . Smokeless tobacco: Never Used  Substance and Sexual Activity  . Alcohol use: [name]    Alcohol/week: 0.0 standard drinks  . Drug use: [name]  . Sexual activity: Not on file  Lifestyle  . Physical activity    Days per week: Not on file    Minutes per session: Not on file  . Stress: Not on file  Relationships  . Social Herbalist on phone: Not on file    Gets together: Not on file    Attends religious service: Not on file    Active member of club or organization: Not on file    Attends meetings of clubs or organizations: Not on file    Relationship status: Not on file  Other Topics Concern  . Not on file  Social History Narrative   Married. 64 and 52 year old daughters in [address]. 4 month old grandkid 10/2016.    Younger daughter married in 2019- and will be moving close to her. 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       Hobbies: read, walk dog, yoga, movies, steelers in the fall   [name] Known Allergies Family History  Problem Relation Age of Onset  .  Heart disease Father        24 MI- 4 stents incl. LAD, former smoker distant  . Lung disease Mother        "shock lung"  . Colon cancer Neg Hx   . Colon polyps Neg Hx   . Rectal cancer Neg Hx   . Stomach cancer Neg Hx   . Esophageal cancer Neg Hx     Current Outpatient Medications (Endocrine & Metabolic):  .  methimazole (TAPAZOLE) 10 MG tablet, TAKE 1/2 TABLET BY MOUTH EVERY DAY   Current Outpatient Medications (Respiratory):  .  diphenhydrAMINE (BENADRYL) 25 MG tablet, Take 25 mg by mouth daily.   [name]  loratadine (CLARITIN) 10 MG tablet, Take 10 mg by mouth daily.    Current Outpatient Medications (Other):  .  clobetasol (TEMOVATE) 0.05 % external solution, Apply 1 application topically as needed.  .  Diclofenac Sodium (PENNSAID) 2 % SOLN, Place 1 application onto the skin 2 (two) times  daily. .  dorzolamide-timolol (COSOPT) 22.3-6.8 MG/ML ophthalmic solution, Place 1 drop into both eyes 2 (two) times daily.  .  Misc Natural Products (TURMERIC CURCUMIN) CAPS, Take 1 capsule by mouth daily. .  Multiple Vitamin (MULTIVITAMIN) tablet, Take 1 tablet by mouth daily.   .  Omega-3 Fatty Acids (FISH OIL) 500 MG CAPS, Take by mouth. 1200 mg daily      Past medical history, social, surgical and family history all reviewed in electronic medical record.  [name] pertanent information unless stated regarding to the chief complaint.   Review of Systems:  [name] headache, visual changes, nausea, vomiting, diarrhea, constipation, dizziness, abdominal pain, skin rash, fevers, chills, night sweats, weight loss, swollen lymph nodes, body aches, joint swelling, muscle aches, chest pain, shortness of breath, mood changes.   Objective  Blood pressure 100/62, pulse (!) 56, height 5\' 5"  (1.651 m), weight 130 lb (59 kg), last menstrual period 03/31/2010, SpO2 99 %. Systems examined below as of    General: [name] apparent distress alert and oriented x3 mood and affect normal, dressed appropriately.  HEENT: Pupils equal, extraocular movements intact  Respiratory: Patient's speak in full sentences and does not appear short of breath  Cardiovascular: [name] lower extremity edema, non tender, [name] erythema  Skin: Warm dry intact with [name] signs of infection or rash on extremities or on axial skeleton.  Abdomen: Soft nontender  Neuro: Cranial nerves II through XII are intact, neurovascularly intact in all extremities with 2+ DTRs and 2+ pulses.  Lymph: [name] lymphadenopathy of posterior or anterior cervical chain or axillae bilaterally.  Gait normal with good balance and coordination.  MSK:  Non tender with full range of motion and good stability and symmetric strength and tone of shoulders, elbows, wrist, hip, knee and ankles bilaterally.  Hand exam shows the patient has full range of motion.  Still mild nodule at the A2  pulley of the ring finger but [name] triggering noted.   Impression and Recommendations:      The above documentation has been reviewed and is accurate and complete 04/02/2010, DO       Note: This dictation was prepared with Dragon dictation along with smaller phrase technology. Any transcriptional errors that result from this process are unintentional.

## 2019-01-22 ENCOUNTER — Other Ambulatory Visit: Payer: Self-pay | Admitting: Family Medicine

## 2019-01-28 ENCOUNTER — Other Ambulatory Visit: Payer: Self-pay | Admitting: Family Medicine

## 2019-01-28 MED ORDER — METHIMAZOLE 10 MG PO TABS: 5.0000 mg | ORAL_TABLET | Freq: Every day | ORAL | 0 refills | Status: DC

## 2019-02-27 ENCOUNTER — Other Ambulatory Visit: Payer: Self-pay

## 2019-02-28 ENCOUNTER — Encounter: Payer: Self-pay | Admitting: Family Medicine

## 2019-02-28 ENCOUNTER — Ambulatory Visit (INDEPENDENT_AMBULATORY_CARE_PROVIDER_SITE_OTHER): Payer: Managed Care, Other (non HMO) | Admitting: Family Medicine

## 2019-02-28 VITALS — BP 102/60 | HR 59 | Temp 98.6°F | Ht 65.0 in | Wt 129.0 lb

## 2019-02-28 DIAGNOSIS — Z0000 Encounter for general adult medical examination without abnormal findings: Secondary | ICD-10-CM

## 2019-02-28 DIAGNOSIS — E0500 Thyrotoxicosis with diffuse goiter without thyrotoxic crisis or storm: Secondary | ICD-10-CM | POA: Diagnosis not present

## 2019-02-28 DIAGNOSIS — E785 Hyperlipidemia, unspecified: Secondary | ICD-10-CM | POA: Diagnosis not present

## 2019-02-28 DIAGNOSIS — J309 Allergic rhinitis, unspecified: Secondary | ICD-10-CM | POA: Diagnosis not present

## 2019-02-28 DIAGNOSIS — L309 Dermatitis, unspecified: Secondary | ICD-10-CM

## 2019-02-28 NOTE — Patient Instructions (Addendum)
Health Maintenance Due  Topic Date Due  . PAP SMEAR-Modifier  Will send for records  10/21/2018   Please stop by lab before you go If you do not have mychart- we will call you about results within 5 business days of [place] receiving them.  If you have mychart- we will send your results within 3 business days of [place] receiving them.  If abnormal or we want to clarify a result, we will call or mychart you to make sure you receive the message.  If you have questions or concerns or don't hear within 5-7 days, please send [place] a message or call us.   No changes today other than being less hard on yourself about weight gain! You are at a perfectly healthy weight still   Recommended follow up: Return in about 1 year (around 02/28/2020) for physical or sooner if needed.

## 2019-02-28 NOTE — Progress Notes (Signed)
Phone: [phone]   Subjective:  Patient presents today for their annual physical. Chief complaint-noted.   See problem oriented charting- Review of Systems  Constitutional: Negative.   HENT: Negative for ear pain, hearing loss and tinnitus.   Eyes: Negative for blurred vision and double vision.  Respiratory: Negative for cough, shortness of breath and wheezing.   Cardiovascular: Negative for chest pain, palpitations and leg swelling.  Gastrointestinal: Negative for nausea and vomiting.  Genitourinary: Negative for dysuria, frequency and urgency.  Musculoskeletal: Negative for back pain, joint pain and neck pain.  Skin: Negative for itching and rash.  Neurological: Negative for dizziness, tremors, weakness and headaches.  Endo/Heme/Allergies: Does not bruise/bleed easily.  Psychiatric/Behavioral: Negative for depression, hallucinations, substance abuse and suicidal ideas.   The following were reviewed and entered/updated in epic: Past Medical History:  Diagnosis Date  . Allergy   . GRAVE'S DISEASE 01/24/2007   Patient Active Problem List   Diagnosis Date Noted  . Graves disease 01/24/2007    Priority: Medium  . Eczema 08/26/2014    Priority: Low  . Allergic rhinitis 08/26/2014    Priority: Low  . History of colonic polyps 04/23/2010    Priority: Low  . Trigger finger, right ring finger 11/26/2018   Past Surgical History:  Procedure Laterality Date  . CARPAL TUNNEL RELEASE     3-4 yeras ago from 2016  . COLONOSCOPY  2010, 2016   polyp in 2010, none 2016  . 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 TONSILLECTOMY  1972    Family History  Problem Relation Age of Onset  . Heart disease Father        11 MI- 4 stents incl. LAD, former smoker distant  . Lung disease Mother        "shock lung"  . Colon cancer Neg Hx   . Colon polyps Neg Hx   . Rectal cancer Neg Hx   . Stomach cancer Neg Hx   . Esophageal cancer Neg Hx     Medications- reviewed and  updated Current Outpatient Medications  Medication Sig Dispense Refill  . diphenhydrAMINE (BENADRYL) 25 MG tablet Take 25 mg by mouth daily.      . dorzolamide-timolol (COSOPT) 22.3-6.8 MG/ML ophthalmic solution Place 1 drop into both eyes 2 (two) times daily.     [name] loratadine (CLARITIN) 10 MG tablet Take 10 mg by mouth daily.    . methimazole (TAPAZOLE) 10 MG tablet Take 10 mg by mouth 3 (three) times daily. 1/2 tab daily    . Misc Natural Products (TURMERIC CURCUMIN) CAPS Take 1 capsule by mouth daily.    . Multiple Vitamin (MULTIVITAMIN) tablet Take 1 tablet by mouth daily.      . Omega-3 Fatty Acids (FISH OIL) 500 MG CAPS Take by mouth. 1200 mg daily       No current facility-administered medications for this visit.    Allergies-reviewed and updated No Known Allergies  Social History   Social History Narrative   Married. 10 (grandson 943) and 34 year old daughters (grandson in late 2020) in 2016. Both live close.    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       Hobbies: read, walk dog, yoga, movies, steelers in the fall   Objective  Objective:  BP 102/60   Pulse (!) 59   Temp 98.6 F (37 C) (Temporal)   Ht 5\' 5"  (  1.651 m)   Wt 129 lb (58.5 kg)   LMP 03/31/2010   SpO2 99%   BMI 21.47 kg/m  Gen: NAD, resting comfortably HEENT: Mask not removed due to covid 19. TM normal. Bridge of nose normal. Eyelids normal.  Neck: no thyromegaly or cervical lymphadenopathy  CV: RRR no murmurs rubs or gallops Lungs: CTAB no crackles, wheeze, rhonchi Abdomen: soft/nontender/nondistended/normal bowel sounds. No rebound or guarding.  Ext: no edema Skin: warm, dry Neuro: grossly normal, moves all extremities, PERRLA   Assessment and Plan   64 y.o. female presenting for annual physical.  Health Maintenance counseling: 1. Anticipatory guidance: Patient counseled regarding regular dental exams q6  months, eye exams- q 4 months with glaucoma history which has been stable ,  avoiding smoking and second hand smoke , limiting alcohol to 1 beverage per day .  Does not drink  2. Risk factor reduction:  Advised patient of need for regular exercise and diet rich and fruits and vegetables to reduce risk of heart attack and stroke. Exercise- daily for on average 1-3 hrs walk and/or run. 650 miles on her bike this summer [place] go [place] Diet- follows healthy diet. Slight weight gain as work life produces less steps- I told her no concern about current weight gain- only 4 lbs and BMI still under 22 Wt Readings from Last 3 Encounters:  02/28/19 129 lb (58.5 kg)  01/08/19 130 lb (59 kg)  11/26/18 128 lb (58.1 kg)  3. Immunizations/screenings/ancillary studies- up to date other than wants to get covid vaccine Immunization History  Administered Date(s) Administered  . Influenza Whole 02/12/2009, 10/13/2009  . Influenza,inj,Quad PF,6+ Mos 01/30/2013, 12/26/2013, 10/29/2018  . Influenza-Unspecified 03/26/2011, 12/06/2015, 02/04/2017, 11/04/2017  . Td 09/09/2009  . Tdap 02/09/2016  . Zoster Recombinat (Shingrix) 02/26/2018, 05/01/2018  4. Cervical cancer screening- followed by GYN yearly 5. Breast cancer screening-  breast exam monthly self exams  and mammogram up to date  09/17/2018 6. Colon cancer screening - 08/2014 and plan was 10 year follow up 7. Skin cancer screening- Dr. Sharyn Lull about 3 years ago. advised regular sunscreen use. Denies worrisome, changing, or new skin lesions.  8. Birth control/STD check- postmenopausal/ monogomous 9. Osteoporosis screening at 42- has had in the past 02/2016- completely normal and walks regularly with good calcium and vitamin D intake (sun wise)- will rescan 10 years -never  smoker  Status of chronic or acute concerns   Graves disease- update labs. Wants to continue chronic methimazole 5 mg daily. Does not want more intervention than this. Refill 5 mg dose  as needed.   Eczema, unspecified type- no recent issues  Allergic rhinitis- worse in spring- ok at moment. Has claritin on hand to use. Has allergy eye drop as well from eye doctor.   glucamoma continues cosopt  Mild hyperlipidemia- 10 year ascvd risk only 2%- discussed would only even consider statin if risk gets above 7.5%- monitor with labs today The 10-year ASCVD risk score Denman George DC Montez Hageman., et al., 2013) is: 2%   Values used to calculate the score:     Age: 47 years     Sex: Female     Is Non-Hispanic African American: No     Diabetic: No     Tobacco smoker: No     Systolic Blood Pressure: 102 mmHg     Is BP treated: No     HDL Cholesterol: 63.3 mg/dL     Total Cholesterol: 185 mg/dL  Recommended follow up: 1 year  CPE  Lab/Order associations: not fasting (propel and some nuts)   ICD-10-CM   1. Preventative health care  Z00.00 CBC with Differential/Platelet    Comprehensive metabolic panel    Lipid panel    TSH    T4, free    T3, free  2. Graves disease  E05.00 TSH    T4, free    T3, free  3. Eczema, unspecified type  L30.9   4. Allergic rhinitis, unspecified seasonality, unspecified trigger  J30.9   5. Hyperlipidemia, unspecified hyperlipidemia type  E78.5 CBC with Differential/Platelet    Comprehensive metabolic panel    Lipid panel   Return precautions advised.  Garret Reddish, MD

## 2019-03-01 LAB — COMPREHENSIVE METABOLIC PANEL
ALT: 19 U/L (ref 0–35)
AST: 26 U/L (ref 0–37)
Albumin: 4.5 g/dL (ref 3.5–5.2)
Alkaline Phosphatase: 69 U/L (ref 39–117)
BUN: 12 mg/dL (ref 6–23)
CO2: 26 mEq/L (ref 19–32)
Calcium: 10 mg/dL (ref 8.4–10.5)
Chloride: 105 mEq/L (ref 96–112)
Creatinine, Ser: 0.74 mg/dL (ref 0.40–1.20)
GFR: 79.75 mL/min (ref >60.00)
Glucose, Bld: 82 mg/dL (ref 70–99)
Potassium: 4.5 mEq/L (ref 3.5–5.1)
Sodium: 142 mEq/L (ref 135–145)
Total Bilirubin: 0.9 mg/dL (ref 0.2–1.2)
Total Protein: 6.9 g/dL (ref 6.0–8.3)

## 2019-03-01 LAB — CBC WITH DIFFERENTIAL/PLATELET
Basophils Absolute: 0.1 10*3/uL (ref 0.0–0.1)
Basophils Relative: 1.3 % (ref 0.0–3.0)
Eosinophils Absolute: 0.1 10*3/uL (ref 0.0–0.7)
Eosinophils Relative: 2.2 % (ref 0.0–5.0)
HCT: 43.7 % (ref 36.0–46.0)
Hemoglobin: 14.7 g/dL (ref 12.0–15.0)
Lymphocytes Relative: 46 % (ref 12.0–46.0)
Lymphs Abs: 2.9 10*3/uL (ref 0.7–4.0)
MCHC: 33.6 g/dL (ref 30.0–36.0)
MCV: 95.1 fl (ref 78.0–100.0)
Monocytes Absolute: 0.5 10*3/uL (ref 0.1–1.0)
Monocytes Relative: 7.7 % (ref 3.0–12.0)
Neutro Abs: 2.7 10*3/uL (ref 1.4–7.7)
Neutrophils Relative %: 42.8 % — ABNORMAL LOW (ref 43.0–77.0)
Platelets: 225 10*3/uL (ref 150.0–400.0)
RBC: 4.6 Mil/uL (ref 3.87–5.11)
RDW: 13.1 % (ref 11.5–15.5)
WBC: 6.3 10*3/uL (ref 4.0–10.5)

## 2019-03-01 LAB — LIPID PANEL
Cholesterol: 202 mg/dL — ABNORMAL HIGH (ref 0–200)
HDL: 71.9 mg/dL (ref >39.00)
LDL Cholesterol: 118 mg/dL — ABNORMAL HIGH (ref 0–99)
NonHDL: 129.69
Total CHOL/HDL Ratio: 3
Triglycerides: 58 mg/dL (ref 0.0–149.0)
VLDL: 11.6 mg/dL (ref 0.0–40.0)

## 2019-03-01 LAB — T4, FREE: Free T4: 0.88 ng/dL (ref 0.60–1.60)

## 2019-03-01 LAB — TSH: TSH: 1.38 u[IU]/mL (ref 0.35–4.50)

## 2019-03-01 LAB — T3, FREE: T3, Free: 3 pg/mL (ref 2.3–4.2)

## 2019-03-21 ENCOUNTER — Other Ambulatory Visit: Payer: Self-pay

## 2019-03-22 ENCOUNTER — Ambulatory Visit (INDEPENDENT_AMBULATORY_CARE_PROVIDER_SITE_OTHER): Payer: Managed Care, Other (non HMO) | Admitting: Family Medicine

## 2019-03-22 ENCOUNTER — Encounter: Payer: Self-pay | Admitting: Family Medicine

## 2019-03-22 VITALS — BP 110/62 | HR 62 | Temp 98.6°F | Ht 65.0 in | Wt 128.0 lb

## 2019-03-22 DIAGNOSIS — M25552 Pain in left hip: Secondary | ICD-10-CM

## 2019-03-22 DIAGNOSIS — L02211 Cutaneous abscess of abdominal wall: Secondary | ICD-10-CM | POA: Diagnosis not present

## 2019-03-22 NOTE — Patient Instructions (Addendum)
Health Maintenance Due  Topic Date Due  . PAP SMEAR-Modifier will send for note  10/21/2018   Warm compresses at least 3x a day for a few more days as long as continues to improve  We can send in an antibiotic if fails to improve but I strongly doubt you are going to need that  For the left hip- if it flares up with going back to running (and I would start out with just 25-33% of what youd normally do) then I would definitely see Dr. Katrinka Blazing   Recommended follow up: as needed for acute concern- if area gets bigger, redder, more painful (may bruise some as I manipulated area today)

## 2019-03-22 NOTE — Progress Notes (Signed)
Phone [phone] In person visit   Subjective:   Kristine [name] is a 64 y.o. year old very pleasant female patient who presents for/with See problem oriented charting Chief Complaint  Patient presents with  . Abscess   This visit occurred during the SARS-CoV-2 public health emergency.  Safety protocols were in place, including screening questions prior to the visit, additional usage of staff PPE, and extensive cleaning of exam room while observing appropriate contact time as indicated for disinfecting solutions.   Past Medical History-  Patient Active Problem List   Diagnosis Date Noted  . Graves disease 01/24/2007    Priority: Medium  . Eczema 08/26/2014    Priority: Low  . Allergic rhinitis 08/26/2014    Priority: Low  . History of colonic polyps 04/23/2010    Priority: Low  . Trigger finger, right ring finger 11/26/2018    Medications- reviewed and updated Current Outpatient Medications  Medication Sig Dispense Refill  . diphenhydrAMINE (BENADRYL) 25 MG tablet Take 25 mg by mouth daily.      . dorzolamide-timolol (COSOPT) 22.3-6.8 MG/ML ophthalmic solution Place 1 drop into both eyes 2 (two) times daily.     [name] loratadine (CLARITIN) 10 MG tablet Take 10 mg by mouth daily.    . methimazole (TAPAZOLE) 10 MG tablet Take 10 mg by mouth 3 (three) times daily. 1/2 tab daily    . Misc Natural Products (TURMERIC CURCUMIN) CAPS Take 1 capsule by mouth daily.    . Multiple Vitamin (MULTIVITAMIN) tablet Take 1 tablet by mouth daily.      . Omega-3 Fatty Acids (FISH OIL) 500 MG CAPS Take by mouth. 1200 mg daily       No current facility-administered medications for this visit.     Objective:  BP 110/62   Pulse 62   Temp 98.6 F (37 C) (Temporal)   Ht 5\' 5"  (1.651 m)   Wt 128 lb (58.1 kg)   LMP 03/31/2010   BMI 21.30 kg/m  Gen: NAD, resting comfortably Skin: On left lower abdomen there is a indurated area about 2 x 2 cm noted with central brownish discoloration.  Area was  palpated and then compressed from multiple different directions-purulent discharge was expressed and after several rounds of this serosanguineous discharge was noted which eventually slowed as well.  Total area of induration decreased to approximately 1 x 1 cm.  Patient reported pain improvement from 6 out of 10 down to about 1-1/2 out of 10  MSK: Left hip-no pain over left greater trochanter.  No pain with external rotation of the hip.  Good range of motion of the hip.  Does have pain with internal rotation and flexion-internal rotation causes no significant pain.    Assessment and Plan  #Abscess or infected cyst S:lower left side of abdomen patient noted. Has improved from yesterday but is red and tender to touch. No drainage from area.   Woke up on Sunday morning with red raised welt with central portion about 50 cent piece size. Over the last few days has shrunk down to about the size of quarter- has central white area and she was considering opening it but did not.  She still feels like area is red and warm to touch but improved overall.  She has about 6 out of 10 pain.  No drainage to the area has been noted. A/P: Strongly suspect abscess or infected cyst-was able to drain without formal incision and drainage today.  Patient noted immediate improvement in size  of lesion as well as pain down from 6 out of 10 to 1 1/2 out of 10.  Encouraged her to continue warm compresses over the weekend-she had not previously used this.  Return cautions were given -She asks if she could have had a spider bite.  I discussed its possible but treatment would remain the same given continued improvement  #Left hip pain S: hasnt run in 3 weeks-  Left hip bothering her. Hurt worse at night after days she ran. Worse with longer runs. trie dnew shoes but didn't help. Tried stretching with yoga but no relief. Rest is helpful- advil for a weektwice a day.  A/P: Left hip pain-with pain patient pointing to her groin as a  source of pain I told her I was concerned about potential underlying arthritis.  Since her hip is doing better she wants to restart running-I asked her to reduce to 25 to 33% of her normal mileage.  She also has a goal of doing 1000 miles on her bike this year.  We discussed if she fails to continue to improve or symptoms worsen when she restarts running that we should have her see sports medicine Dr. Caryl Asp agrees to call us if this occurs  Recommended follow up: Scheduled for physical next year Future Appointments  Date Time Provider Department Center  03/02/2020  4:00 PM [name], MD [facility]   Lab/Order associations:   ICD-10-CM   1. Abscess of skin of abdomen  L02.211   2. Left hip pain  M25.552    Time Spent: 25 minutes of total time (11:40 PM- 12:05 PM) was spent on the date of the encounter performing the following actions: chart review prior to seeing the patient, obtaining history, performing a medically necessary exam, counseling on the treatment plan, placing orders, and documenting in our EHR.   Return precautions advised.  Tana Conch, MD

## 2019-03-23 ENCOUNTER — Ambulatory Visit: Payer: Managed Care, Other (non HMO)

## 2019-04-10 ENCOUNTER — Encounter: Payer: Self-pay | Admitting: Family Medicine

## 2019-04-24 ENCOUNTER — Other Ambulatory Visit: Payer: Self-pay | Admitting: Family Medicine

## 2019-04-30 ENCOUNTER — Ambulatory Visit (INDEPENDENT_AMBULATORY_CARE_PROVIDER_SITE_OTHER): Payer: Managed Care, Other (non HMO)

## 2019-04-30 ENCOUNTER — Encounter: Payer: Self-pay | Admitting: Family Medicine

## 2019-04-30 ENCOUNTER — Ambulatory Visit (INDEPENDENT_AMBULATORY_CARE_PROVIDER_SITE_OTHER): Payer: Managed Care, Other (non HMO) | Admitting: Family Medicine

## 2019-04-30 ENCOUNTER — Other Ambulatory Visit: Payer: Self-pay

## 2019-04-30 VITALS — BP 104/74 | Ht 65.0 in | Wt 128.0 lb

## 2019-04-30 DIAGNOSIS — M79641 Pain in right hand: Secondary | ICD-10-CM

## 2019-04-30 DIAGNOSIS — M65341 Trigger finger, right ring finger: Secondary | ICD-10-CM | POA: Diagnosis not present

## 2019-04-30 DIAGNOSIS — M1811 Unilateral primary osteoarthritis of first carpometacarpal joint, right hand: Secondary | ICD-10-CM | POA: Diagnosis not present

## 2019-04-30 NOTE — Patient Instructions (Signed)
Good to see you See me in 4 weeks if no better we will inject

## 2019-04-30 NOTE — Assessment & Plan Note (Signed)
Chronic problem : Exacerbation  interventions previously, including medication management: Last injection in October and was A2 pulley now A1 pulley   Interventions this visit: Injection at the A1 pulley We discussed with patient the importance ergonomics, home exercises, icing regimen, and over-the-counter natural products.   Future considerations but will be based on evaluation and next visit: Consider possibility of surgical intervention if no significant improvement or occupational therapy for hand services    Return to clinic: 6 weeks

## 2019-04-30 NOTE — Progress Notes (Signed)
[place] Phone: [phone] Subjective:   [name], am serving as a scribe for Dr. Hulan Saas. This visit occurred during the SARS-CoV-2 public health emergency.  Safety protocols were in place, including screening questions prior to the visit, additional usage of staff PPE, and extensive cleaning of exam room while observing appropriate contact time as indicated for disinfecting solutions.   I'm seeing this patient by the request  of:  Kristine Olp, MD  CC: Hand pain, hip pain  [id number]   01/08/2019 Patient is doing very well after the injection.  Having no significant pain at the moment.  Discussed icing regimen and home exercises, discussed which activities to doing which wants to avoid.  Patient is to increase activity as tolerated.  Follow-up PRN  Update 04/30/2019 Kristine [name] is a 64 y.o. female coming in with complaint of right, ring finger pain. Patient states that the trigger finger has begun again in the past week. Wears a brace night patient feels that the repetitive motion seems to make it worse.Kristine [name]  Also having left hip pain over anterior aspect. Is unable to run as she has pain afterwards. Is having right side sided lower back pain as well.  Patient states that it does go away after a while.     Past Medical History:  Diagnosis Date  . Allergy   . [place] DISEASE 01/24/2007   Past Surgical History:  Procedure Laterality Date  . CARPAL TUNNEL RELEASE     3-4 yeras ago from 2016  . COLONOSCOPY  2010, 2016   polyp in 2010, none 2016  . 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 TONSILLECTOMY  1972   Social History   Socioeconomic History  . Marital status: Married    Spouse name: Not on file  . Number of children: Not on file  . Years of education: Not on file  . Highest education level: Not on file  Occupational History  . Not on file  Tobacco Use  . Smoking  status: Never Smoker  . Smokeless tobacco: Never Used  Substance and Sexual Activity  . Alcohol use: No    Alcohol/week: 0.0 standard drinks  . Drug use: No  . Sexual activity: Not on file  Other Topics Concern  . Not on file  Social History Narrative   Married. 49 (grandson [id number]) and 61 year old daughters (grandson in late 2020) in 2016. Both live close.    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       Hobbies: read, walk dog, yoga, movies, steelers in the fall   Social Determinants of Health   Financial Resource Strain:   . Difficulty of Paying Living Expenses:   Food Insecurity:   . Worried About Charity fundraiser in the Last Year:   . Arboriculturist in the Last Year:   Transportation Needs:   . Film/video editor (Medical):   Kristine [name] Lack of Transportation (Non-Medical):   Physical Activity:   . Days of Exercise per Week:   . Minutes of Exercise per Session:   Stress:   . Feeling of Stress :   Social Connections:   . Frequency of Communication with Friends and Family:   . Frequency of Social Gatherings with Friends and Family:   . Attends Religious Services:   .  Active Member of Clubs or Organizations:   . Attends Banker Meetings:   Kristine [name] Marital Status:    No Known Allergies Family History  Problem Relation Age of Onset  . Heart disease Father        30 MI- 4 stents incl. LAD, former smoker distant  . Lung disease Mother        "shock lung"  . Colon cancer Neg Hx   . Colon polyps Neg Hx   . Rectal cancer Neg Hx   . Stomach cancer Neg Hx   . Esophageal cancer Neg Hx     Current Outpatient Medications (Endocrine & Metabolic):  .  methimazole (TAPAZOLE) 10 MG tablet, TAKE 1/2 TABLET BY MOUTH DAILY   Current Outpatient Medications (Respiratory):  .  diphenhydrAMINE (BENADRYL) 25 MG tablet, Take 25 mg by mouth daily.   Kristine [name]  loratadine (CLARITIN) 10 MG tablet, Take  10 mg by mouth daily.    Current Outpatient Medications (Other):  .  dorzolamide-timolol (COSOPT) 22.3-6.8 MG/ML ophthalmic solution, Place 1 drop into both eyes 2 (two) times daily.  .  Misc Natural Products (TURMERIC CURCUMIN) CAPS, Take 1 capsule by mouth daily. .  Multiple Vitamin (MULTIVITAMIN) tablet, Take 1 tablet by mouth daily.   .  Omega-3 Fatty Acids (FISH OIL) 500 MG CAPS, Take by mouth. 1200 mg daily     Reviewed prior external information including notes and imaging from  primary care provider As well as notes that were available from care everywhere and other healthcare systems.  Past medical history, social, surgical and family history all reviewed in electronic medical record.  No pertanent information unless stated regarding to the chief complaint.   Review of Systems:  No headache, visual changes, nausea, vomiting, diarrhea, constipation, dizziness, abdominal pain, skin rash, fevers, chills, night sweats, weight loss, swollen lymph nodes, body aches, joint swelling, chest pain, shortness of breath, mood changes. POSITIVE muscle aches  Objective  Blood pressure 104/74, height 5\' 5"  (1.651 m), weight 128 lb (58.1 kg), last menstrual period 03/31/2010.   General: No apparent distress alert and oriented x3 mood and affect normal, dressed appropriately.  HEENT: Pupils equal, extraocular movements intact  Respiratory: Patient's speak in full sentences and does not appear short of breath  Cardiovascular: No lower extremity edema, non tender, no erythema  Neuro: Cranial nerves II through XII are intact, neurovascularly intact in all extremities with 2+ DTRs and 2+ pulses.  Gait normal with good balance and coordination.  MSK: Fourth finger ring, shows the patient does have triggering at the A1 pulley.  Right sided.  This is different than the previous exam which was at the A2 pulley.  Patient's right CMC does have arthritic changes noted as well.  Limited musculoskeletal  ultrasound was performed and interpreted by 04/02/2010  Limited ultrasound shows the patient does have a trigger nodule in the flexor tendon sheath of the right finger.  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  Verbal informed consent obtained.  Time-out conducted.  Noted no overlying erythema, induration, or other signs of local infection.  Skin prepped in a sterile fashion.  Local anesthesia: Topical  Ethyl chloride.  With sterile technique and under real time ultrasound guidance: With a 25-gauge half inch needle injected with 0.5 cc of 0.5% Marcaine and 0.5 cc of Kenalog 40 mg/mL Completed without difficulty  Pain immediately resolved suggesting accurate placement of the medication.  Advised to call if fevers/chills, erythema, induration, drainage, or persistent bleeding.  Images permanently stored and available for review in the ultrasound unit.  Impression: Technically successful ultrasound guided injection.    Impression and Recommendations:     This case required medical decision making of moderate complexity. The above documentation has been reviewed and is accurate and complete Judi Saa, DO       Note: This dictation was prepared with Dragon dictation along with smaller phrase technology. Any transcriptional errors that result from this process are unintentional.

## 2019-04-30 NOTE — Assessment & Plan Note (Signed)
CMC arthritis.  Discussed icing regimen with home exercises, discussed potential bracing.  Worsening symptoms consider injection and x-rays.

## 2019-05-28 ENCOUNTER — Encounter: Payer: Self-pay | Admitting: Family Medicine

## 2019-05-28 ENCOUNTER — Other Ambulatory Visit: Payer: Self-pay

## 2019-05-28 ENCOUNTER — Ambulatory Visit (INDEPENDENT_AMBULATORY_CARE_PROVIDER_SITE_OTHER): Payer: Managed Care, Other (non HMO) | Admitting: Family Medicine

## 2019-05-28 DIAGNOSIS — M65341 Trigger finger, right ring finger: Secondary | ICD-10-CM | POA: Diagnosis not present

## 2019-05-28 NOTE — Patient Instructions (Signed)
Pull towards opp shoulder for one exercise Good to see you.  Turmeric 500mg  daily  Tart cherry extract 1200mg  at night Vitamin D 2000 IU daily  See me when you need me

## 2019-05-28 NOTE — Progress Notes (Signed)
[name] Phone: [phone] Subjective:   [name], am serving as a scribe for Dr. Hulan Saas. This visit occurred during the SARS-CoV-2 public health emergency.  Safety protocols were in place, including screening questions prior to the visit, additional usage of staff PPE, and extensive cleaning of exam room while observing appropriate contact time as indicated for disinfecting solutions.   I'm seeing this patient by the request  of:  Marin Olp, MD  CC: Right ring finger follow-up, mild hip pain follow-up  [id number]   3/238/2021 Chronic problem : Exacerbation  interventions previously, including medication management: Last injection in October and was A2 pulley now A1 pulley   Interventions this visit: Injection at the A1 pulley We discussed with patient the importance ergonomics, home exercises, icing regimen, and over-the-counter natural products.   Future considerations but will be based on evaluation and next visit: Consider possibility of surgical intervention if no significant improvement or occupational therapy for hand services   Update 05/28/2019 Kristine [name] is a 64 y.o. female coming in with complaint of right hand, ring finger pain. Patient states that she continues to wear the brace at night. Has locking when she wakes up.  Patient states no significant locking but does feel more comfortable with wearing the brace at night  States that exercises for her hip pain have also helped. Has not tried to run or bike to test it out.       Past Medical History:  Diagnosis Date  . Allergy   . [place] DISEASE 01/24/2007   Past Surgical History:  Procedure Laterality Date  . CARPAL TUNNEL RELEASE     3-4 yeras ago from 2016  . COLONOSCOPY  2010, 2016   polyp in 2010, none 2016  . 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 TONSILLECTOMY  1972   Social History    Socioeconomic History  . Marital status: Married    Spouse name: Not on file  . Number of children: Not on file  . Years of education: Not on file  . Highest education level: Not on file  Occupational History  . Not on file  Tobacco Use  . Smoking status: Never Smoker  . Smokeless tobacco: Never Used  Substance and Sexual Activity  . Alcohol use: No    Alcohol/week: 0.0 standard drinks  . Drug use: No  . Sexual activity: Not on file  Other Topics Concern  . Not on file  Social History Narrative   Married. 13 (grandson 928) and 63 year old daughters (grandson in late 2020) in 2016. Both live close.    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       Hobbies: read, walk dog, yoga, movies, steelers in the fall   Social Determinants of Health   Financial Resource Strain:   . Difficulty of Paying Living Expenses:   Food Insecurity:   . Worried About Charity fundraiser in the Last Year:   . Arboriculturist in the Last Year:   Transportation Needs:   . Film/video editor (Medical):   [name] Lack of Transportation (Non-Medical):   Physical Activity:   . Days of Exercise per Week:   . Minutes of Exercise per Session:   Stress:   . Feeling of Stress :   Social Connections:   .  Frequency of Communication with Friends and Family:   . Frequency of Social Gatherings with Friends and Family:   . Attends Religious Services:   . Active Member of Clubs or Organizations:   . Attends Banker Meetings:   [name] Marital Status:    No Known Allergies Family History  Problem Relation Age of Onset  . Heart disease Father        22 MI- 4 stents incl. LAD, former smoker distant  . Lung disease Mother        "shock lung"  . Colon cancer Neg Hx   . Colon polyps Neg Hx   . Rectal cancer Neg Hx   . Stomach cancer Neg Hx   . Esophageal cancer Neg Hx     Current Outpatient Medications  (Endocrine & Metabolic):  .  methimazole (TAPAZOLE) 10 MG tablet, TAKE 1/2 TABLET BY MOUTH DAILY   Current Outpatient Medications (Respiratory):  .  diphenhydrAMINE (BENADRYL) 25 MG tablet, Take 25 mg by mouth daily.   [name]  loratadine (CLARITIN) 10 MG tablet, Take 10 mg by mouth daily.    Current Outpatient Medications (Other):  .  dorzolamide-timolol (COSOPT) 22.3-6.8 MG/ML ophthalmic solution, Place 1 drop into both eyes 2 (two) times daily.  .  Misc Natural Products (TURMERIC CURCUMIN) CAPS, Take 1 capsule by mouth daily. .  Multiple Vitamin (MULTIVITAMIN) tablet, Take 1 tablet by mouth daily.   .  Omega-3 Fatty Acids (FISH OIL) 500 MG CAPS, Take by mouth. 1200 mg daily     Reviewed prior external information including notes and imaging from  primary care provider As well as notes that were available from care everywhere and other healthcare systems.  Past medical history, social, surgical and family history all reviewed in electronic medical record.  No pertanent information unless stated regarding to the chief complaint.   Review of Systems:  No headache, visual changes, nausea, vomiting, diarrhea, constipation, dizziness, abdominal pain, skin rash, fevers, chills, night sweats, weight loss, swollen lymph nodes, body aches, joint swelling, chest pain, shortness of breath, mood changes. POSITIVE muscle aches  Objective  Blood pressure 120/72, pulse (!) 54, height 5\' 5"  (1.651 m), weight 132 lb (59.9 kg), last menstrual period 03/31/2010, SpO2 98 %.   General: No apparent distress alert and oriented x3 mood and affect normal, dressed appropriately.  HEENT: Pupils equal, extraocular movements intact  Respiratory: Patient's speak in full sentences and does not appear short of breath  Cardiovascular: No lower extremity edema, non tender, no erythema  Neuro: Cranial nerves II through XII are intact, neurovascularly intact in all extremities with 2+ DTRs and 2+ pulses.  Gait normal with  good balance and coordination.  MSK:  Non tender with full range of motion and good stability and symmetric strength and tone of shoulders, elbows, wrist, hip, knee and ankles bilaterally.  Hand exam shows the patient has no tenderness at this time.  No locking noted at the A2 pulley anymore of the ring finger.   Impression and Recommendations:     The above documentation has been reviewed and is accurate and complete 04/02/2010, DO       Note: This dictation was prepared with Dragon dictation along with smaller phrase technology. Any transcriptional errors that result from this process are unintentional.

## 2019-05-28 NOTE — Assessment & Plan Note (Signed)
Significant improvement at this time.  No change in management.  Follow-up with me as needed

## 2019-09-12 ENCOUNTER — Other Ambulatory Visit: Payer: Self-pay

## 2019-09-12 ENCOUNTER — Ambulatory Visit (INDEPENDENT_AMBULATORY_CARE_PROVIDER_SITE_OTHER): Payer: Managed Care, Other (non HMO) | Admitting: Family Medicine

## 2019-09-12 ENCOUNTER — Encounter: Payer: Self-pay | Admitting: Family Medicine

## 2019-09-12 ENCOUNTER — Ambulatory Visit: Payer: Self-pay

## 2019-09-12 VITALS — BP 122/78 | HR 47 | Ht 65.0 in | Wt 127.0 lb

## 2019-09-12 DIAGNOSIS — M6530 Trigger finger, unspecified finger: Secondary | ICD-10-CM | POA: Diagnosis not present

## 2019-09-12 DIAGNOSIS — M65341 Trigger finger, right ring finger: Secondary | ICD-10-CM

## 2019-09-12 NOTE — Patient Instructions (Addendum)
Good to see you  tumeric 500 mg Ok to do tart cherry 2400 Vitamin D 2,000  See me again in 6-8 weeks just in case

## 2019-09-12 NOTE — Progress Notes (Signed)
[name] [address] [place] [address] Phone: [phone] Subjective:   I Kristine [name] am serving as a Neurosurgeon for Dr. Antoine Primas.  This visit occurred during the SARS-CoV-2 public health emergency.  Safety protocols were in place, including screening questions prior to the visit, additional usage of staff PPE, and extensive cleaning of exam room while observing appropriate contact time as indicated for disinfecting solutions.   I'm seeing this patient by the request  of:  [name], MD  CC: Finger pain follow-up  [id number]   05/28/2019 (Physician)              Significant improvement at this time.  No change in management.  Follow-up with me as needed       Update 09/12/2019 Kristine [name] is a 64 y.o. female coming in with complaint of right ring finger trigger finger and left hip pain. Patient states the hand is not doing well. Types on the computer a lot and believes it contributes to her pain. Left hip exercises have helped. States the hip still hurts but she has made progress. Has started tart cherry.  Patient would state that the hip is about 80 to 90% better.  Finger seems to be the more concerning part     Past Medical History:  Diagnosis Date  . Allergy   . GRAVE'S DISEASE 01/24/2007   Past Surgical History:  Procedure Laterality Date  . CARPAL TUNNEL RELEASE     3-4 yeras ago from 2016  . COLONOSCOPY  2010, 2016   polyp in 2010, none 2016  . 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 TONSILLECTOMY  1972   Social History   Socioeconomic History  . Marital status: Married    Spouse name: Not on file  . Number of children: Not on file  . Years of education: Not on file  . Highest education level: Not on file  Occupational History  . Not on file  Tobacco Use  . Smoking status: Never Smoker  . Smokeless tobacco: Never Used  Substance and Sexual Activity  . Alcohol use: No    Alcohol/week: 0.0  standard drinks  . Drug use: No  . Sexual activity: Not on file  Other Topics Concern  . Not on file  Social History Narrative   Married. 1 (grandson [id number]) and 42 year old daughters (grandson in late 2020) in 2016. Both live close.    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       Hobbies: read, walk dog, yoga, movies, steelers in the fall   Social Determinants of Health   Financial Resource Strain:   . Difficulty of Paying Living Expenses:   Food Insecurity:   . Worried About Programme researcher, broadcasting/film/video in the Last Year:   . Barista in the Last Year:   Transportation Needs:   . Freight forwarder (Medical):   [name] Lack of Transportation (Non-Medical):   Physical Activity:   . Days of Exercise per Week:   . Minutes of Exercise per Session:   Stress:   . Feeling of Stress :   Social Connections:   . Frequency of Communication with Friends and Family:   . Frequency of Social Gatherings with Friends and Family:   . Attends Religious Services:   . Active Member of Clubs or Organizations:   .  Attends Banker Meetings:   [name] Marital Status:    No Known Allergies Family History  Problem Relation Age of Onset  . Heart disease Father        77 MI- 4 stents incl. LAD, former smoker distant  . Lung disease Mother        "shock lung"  . Colon cancer Neg Hx   . Colon polyps Neg Hx   . Rectal cancer Neg Hx   . Stomach cancer Neg Hx   . Esophageal cancer Neg Hx     Current Outpatient Medications (Endocrine & Metabolic):  .  methimazole (TAPAZOLE) 10 MG tablet, TAKE 1/2 TABLET BY MOUTH DAILY   Current Outpatient Medications (Respiratory):  .  diphenhydrAMINE (BENADRYL) 25 MG tablet, Take 25 mg by mouth daily.   [name]  loratadine (CLARITIN) 10 MG tablet, Take 10 mg by mouth daily.    Current Outpatient Medications (Other):  .  dorzolamide-timolol (COSOPT) 22.3-6.8 MG/ML ophthalmic  solution, Place 1 drop into both eyes 2 (two) times daily.  .  Misc Natural Products (TURMERIC CURCUMIN) CAPS, Take 1 capsule by mouth daily. .  Multiple Vitamin (MULTIVITAMIN) tablet, Take 1 tablet by mouth daily.   .  Omega-3 Fatty Acids (FISH OIL) 500 MG CAPS, Take by mouth. 1200 mg daily     Reviewed prior external information including notes and imaging from  primary care provider As well as notes that were available from care everywhere and other healthcare systems.  Past medical history, social, surgical and family history all reviewed in electronic medical record.  No pertanent information unless stated regarding to the chief complaint.   Review of Systems:  No headache, visual changes, nausea, vomiting, diarrhea, constipation, dizziness, abdominal pain, skin rash, fevers, chills, night sweats, weight loss, swollen lymph nodes, body aches, joint swelling, chest pain, shortness of breath, mood changes. POSITIVE muscle aches  Objective  Last menstrual period 03/31/2010.   General: No apparent distress alert and oriented x3 mood and affect normal, dressed appropriately.  HEENT: Pupils equal, extraocular movements intact  Respiratory: Patient's speak in full sentences and does not appear short of breath  Cardiovascular: No lower extremity edema, non tender, no erythema  Neuro: Cranial nerves II through XII are intact, neurovascularly intact in all extremities with 2+ DTRs and 2+ pulses.  Gait normal with good balance and coordination.  MSK:  Non tender with full range of motion and good stability and symmetric strength and tone of shoulders, elbows, wrist, , knee and ankles bilaterally.  Left hip still has some tightness noted over the tensor fascia lata.  Mild impingement of the hip noted.  5 out of 5 strength though of the lower extremity noted.  Full internal range of motion of the hip.  Right hand exam shows ring finger does have a trigger nodule noted at the A2 pulley.  Triggering  noted today.  Tender to palpation  Procedure: Real-time Ultrasound Guided Injection of right fourth finger flexor tendon sheath Device: GE Logiq Q7 Ultrasound guided injection is preferred based studies that show increased duration, increased effect, greater accuracy, decreased procedural pain, increased response rate, and decreased cost with ultrasound guided versus blind injection.  Verbal informed consent obtained.  Time-out conducted.  Noted no overlying erythema, induration, or other signs of local infection.  Skin prepped in a sterile fashion.  Local anesthesia: Topical Ethyl chloride.  With sterile technique and under real time ultrasound guidance: With a 25-gauge half inch needle injected with 0.5 cc of  0.5% Marcaine and 0.5 cc of Kenalog 40 mg/mL Completed without difficulty  Pain immediately resolved suggesting accurate placement of the medication.  Advised to call if fevers/chills, erythema, induration, drainage, or persistent bleeding.  Images permanently stored and available for review in the ultrasound unit.  Impression: Technically successful ultrasound guided injection.   Impression and Recommendations:     The above documentation has been reviewed and is accurate and complete [name]       Note: This dictation was prepared with Dragon dictation along with smaller phrase technology. Any transcriptional errors that result from this process are unintentional.

## 2019-09-13 ENCOUNTER — Encounter: Payer: Self-pay | Admitting: Family Medicine

## 2019-09-13 NOTE — Assessment & Plan Note (Signed)
Repeat injection given today, tolerated the procedure well, discussed icing regimen and home exercise, which activities to do which wants to avoid.  Patient is to increase activity slowly.  Bracing at night.  If more than 3 injections in a 34-month.  Would consider referral to surgical intervention

## 2019-10-28 ENCOUNTER — Ambulatory Visit: Payer: Managed Care, Other (non HMO) | Admitting: Family Medicine

## 2019-10-29 ENCOUNTER — Ambulatory Visit (INDEPENDENT_AMBULATORY_CARE_PROVIDER_SITE_OTHER)
Admission: RE | Admit: 2019-10-29 | Discharge: 2019-10-29 | Disposition: A | Discharge status: Home / Self Care | Payer: Managed Care, Other (non HMO) | Source: Ambulatory Visit | Attending: Family Medicine | Admitting: Family Medicine

## 2019-10-29 ENCOUNTER — Encounter: Payer: Self-pay | Admitting: Family Medicine

## 2019-10-29 ENCOUNTER — Other Ambulatory Visit: Payer: Self-pay

## 2019-10-29 ENCOUNTER — Ambulatory Visit (INDEPENDENT_AMBULATORY_CARE_PROVIDER_SITE_OTHER): Payer: Managed Care, Other (non HMO) | Admitting: Family Medicine

## 2019-10-29 DIAGNOSIS — M25552 Pain in left hip: Secondary | ICD-10-CM | POA: Diagnosis not present

## 2019-10-29 DIAGNOSIS — M25551 Pain in right hip: Secondary | ICD-10-CM | POA: Insufficient documentation

## 2019-10-29 NOTE — Assessment & Plan Note (Signed)
Left hip pain.  Seems to be more inferior.  Questionable impingement with arthritic changes.  Patient is very young and very active.  History of Graves' disease.  X-rays pending.  Discussed potentially from physical therapy which patient declined.  Do think she could respond well to this and will follow up in my chart message in 1 month.  Differential includes femoral acetabular impingement as well as labral pathology.  No change in medications.  Follow-up again as needed otherwise

## 2019-10-29 NOTE — Patient Instructions (Signed)
Good to see you [address] for left hip xray Keep up the exercises and stretches Send me a mychart message in a month If still having trouble we will think about PT

## 2019-10-29 NOTE — Progress Notes (Signed)
Kristine [name] [address] [place] [address] Phone: [phone] Subjective:   I Kristine [name] am serving as a Neurosurgeon for Dr. Antoine Primas.  This visit occurred during the SARS-CoV-2 public health emergency.  Safety protocols were in place, including screening questions prior to the visit, additional usage of staff PPE, and extensive cleaning of exam room while observing appropriate contact time as indicated for disinfecting solutions.   I'm seeing this patient by the request  of:  [name], MD  CC: Finger pain follow-up, left hip pain  [id number]   09/12/2019 Repeat injection given today, tolerated the procedure well, discussed icing regimen and home exercise, which activities to do which wants to avoid.  Patient is to increase activity slowly.  Bracing at night.  If more than 3 injections in a 64-month.  Would consider referral to surgical intervention   Update 10/29/2019 Kristine [name] is a 64 y.o. female coming in with complaint of right hand, 4th finger trigger finger and left hip pain. Patient states her finger is doing well and is the best it has been. Left hip pain is somewhat better. Pain with crossing her legs, sleeping and yoga poses.      Past Medical History:  Diagnosis Date  . Allergy   . GRAVE'S DISEASE 01/24/2007   Past Surgical History:  Procedure Laterality Date  . CARPAL TUNNEL RELEASE     3-4 yeras ago from 2016  . COLONOSCOPY  2010, 2016   polyp in 2010, none 2016  . 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 TONSILLECTOMY  1972   Social History   Socioeconomic History  . Marital status: Married    Spouse name: Not on file  . Number of children: Not on file  . Years of education: Not on file  . Highest education level: Not on file  Occupational History  . Not on file  Tobacco Use  . Smoking status: Never Smoker  . Smokeless tobacco: Never Used  Substance and Sexual Activity  . Alcohol use: No     Alcohol/week: 0.0 standard drinks  . Drug use: No  . Sexual activity: Not on file  Other Topics Concern  . Not on file  Social History Narrative   Married. 50 (grandson [id number]) and 49 year old daughters (grandson in late 2020) in 2016. Both live close.    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       Hobbies: read, walk dog, yoga, movies, steelers in the fall   Social Determinants of Health   Financial Resource Strain:   . Difficulty of Paying Living Expenses: Not on file  Food Insecurity:   . Worried About Programme researcher, broadcasting/film/video in the Last Year: Not on file  . Ran Out of Food in the Last Year: Not on file  Transportation Needs:   . Lack of Transportation (Medical): Not on file  . Lack of Transportation (Non-Medical): Not on file  Physical Activity:   . Days of Exercise per Week: Not on file  . Minutes of Exercise per Session: Not on file  Stress:   . Feeling of Stress : Not on file  Social Connections:   . Frequency of Communication with Friends and Family: Not on file  . Frequency of Social Gatherings with Friends and Family: Not on file  . Attends Religious Services: Not on file  .  Active Member of Clubs or Organizations: Not on file  . Attends Banker Meetings: Not on file  . Marital Status: Not on file   No Known Allergies Family History  Problem Relation Age of Onset  . Heart disease Father        69 MI- 4 stents incl. LAD, former smoker distant  . Lung disease Mother        "shock lung"  . Colon cancer Neg Hx   . Colon polyps Neg Hx   . Rectal cancer Neg Hx   . Stomach cancer Neg Hx   . Esophageal cancer Neg Hx     Current Outpatient Medications (Endocrine & Metabolic):  .  methimazole (TAPAZOLE) 10 MG tablet, TAKE 1/2 TABLET BY MOUTH DAILY   Current Outpatient Medications (Respiratory):  .  diphenhydrAMINE (BENADRYL) 25 MG tablet, Take 25 mg by mouth daily.   [name]   loratadine (CLARITIN) 10 MG tablet, Take 10 mg by mouth daily.    Current Outpatient Medications (Other):  .  dorzolamide-timolol (COSOPT) 22.3-6.8 MG/ML ophthalmic solution, Place 1 drop into both eyes 2 (two) times daily.  .  Misc Natural Products (TURMERIC CURCUMIN) CAPS, Take 1 capsule by mouth daily. .  Multiple Vitamin (MULTIVITAMIN) tablet, Take 1 tablet by mouth daily.   .  Omega-3 Fatty Acids (FISH OIL) 500 MG CAPS, Take by mouth. 1200 mg daily     Reviewed prior external information including notes and imaging from  primary care provider As well as notes that were available from care everywhere and other healthcare systems.  Past medical history, social, surgical and family history all reviewed in electronic medical record.  No pertanent information unless stated regarding to the chief complaint.   Review of Systems:  No headache, visual changes, nausea, vomiting, diarrhea, constipation, dizziness, abdominal pain, skin rash, fevers, chills, night sweats, weight loss, swollen lymph nodes, body aches, joint swelling, chest pain, shortness of breath, mood changes. POSITIVE muscle aches  Objective  Blood pressure 116/80, pulse (!) 54, height 5\' 5"  (1.651 m), weight 126 lb (57.2 kg), last menstrual period 03/31/2010, SpO2 98 %.   General: No apparent distress alert and oriented x3 mood and affect normal, dressed appropriately.  HEENT: Pupils equal, extraocular movements intact  Respiratory: Patient's speak in full sentences and does not appear short of breath  Cardiovascular: No lower extremity edema, non tender, no erythema  Neuro: Cranial nerves II through XII are intact, neurovascularly intact in all extremities with 2+ DTRs and 2+ pulses.  Gait normal with good balance and coordination.  MSK: Left hip exam shows the patient does have some mild decrease in internal and external range of motion compared to the contralateral side.  Patient has pain more on the anterior lateral  aspect of the hip.  No pain with resisted flexion.  No back pain associated with it.  No abdominal pain noted.  No bulging or masses appreciated.    Impression and Recommendations:     The above documentation has been reviewed and is accurate and complete 04/02/2010, DO       Note: This dictation was prepared with Dragon dictation along with smaller phrase technology. Any transcriptional errors that result from this process are unintentional.

## 2019-12-09 ENCOUNTER — Encounter: Payer: Self-pay | Admitting: Family Medicine

## 2019-12-09 ENCOUNTER — Other Ambulatory Visit: Payer: Self-pay

## 2019-12-09 DIAGNOSIS — M25552 Pain in left hip: Secondary | ICD-10-CM

## 2019-12-09 DIAGNOSIS — G8929 Other chronic pain: Secondary | ICD-10-CM

## 2019-12-09 DIAGNOSIS — M545 Low back pain, unspecified: Secondary | ICD-10-CM

## 2019-12-09 DIAGNOSIS — M25551 Pain in right hip: Secondary | ICD-10-CM

## 2019-12-26 ENCOUNTER — Ambulatory Visit: Payer: Managed Care, Other (non HMO)

## 2020-01-14 ENCOUNTER — Other Ambulatory Visit: Payer: Self-pay

## 2020-01-14 ENCOUNTER — Ambulatory Visit: Payer: Managed Care, Other (non HMO) | Attending: Family Medicine

## 2020-01-14 DIAGNOSIS — M25551 Pain in right hip: Secondary | ICD-10-CM | POA: Insufficient documentation

## 2020-01-14 DIAGNOSIS — M25552 Pain in left hip: Secondary | ICD-10-CM | POA: Diagnosis not present

## 2020-01-14 DIAGNOSIS — M6281 Muscle weakness (generalized): Secondary | ICD-10-CM | POA: Diagnosis present

## 2020-01-14 DIAGNOSIS — M545 Low back pain, unspecified: Secondary | ICD-10-CM | POA: Diagnosis present
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
## 2020-01-14 NOTE — Patient Instructions (Signed)
Access Code: [email or web address] URL: [email or web address] Date: 01/14/2020 Prepared by: Gardiner Rhyme  Exercises Clamshell with Resistance - 1 x daily - 7 x weekly - 2 sets - 10 reps - 5 seconds hold Beginner Bridge - 1 x daily - 7 x weekly - 1-2 sets - 10 reps - 5 seconds hold Sidelying Hip Abduction - 1 x daily - 7 x weekly - 3 sets - 10 reps

## 2020-01-20 ENCOUNTER — Ambulatory Visit: Payer: Managed Care, Other (non HMO) | Admitting: Physical Therapy

## 2020-01-20 ENCOUNTER — Other Ambulatory Visit: Payer: Self-pay

## 2020-01-20 ENCOUNTER — Encounter: Payer: Self-pay | Admitting: Physical Therapy

## 2020-01-20 DIAGNOSIS — M25552 Pain in left hip: Secondary | ICD-10-CM

## 2020-01-20 DIAGNOSIS — M545 Low back pain, unspecified: Secondary | ICD-10-CM

## 2020-01-20 DIAGNOSIS — M25551 Pain in right hip: Secondary | ICD-10-CM

## 2020-01-20 DIAGNOSIS — M6281 Muscle weakness (generalized): Secondary | ICD-10-CM

## 2020-01-20 NOTE — Therapy (Signed)
[facility] [address] [place], [place], [address] Phone: [phone]   Fax:  [phone]  Physical Therapy Treatment  Patient Details  Name: Kristine [name] MRN: [id number] Date of Birth: 08-06-1955 Referring Provider (PT): Antoine Primas, DO   Encounter Date: 01/20/2020   PT End of Session - 01/20/20 0953    Visit Number 2    Number of Visits 10    Date for PT Re-Evaluation 02/25/20    PT Start Time 0947   17 minutes late   PT Stop Time 1017    PT Time Calculation (min) 30 min           Past Medical History:  Diagnosis Date  . Allergy   . GRAVE'S DISEASE 01/24/2007    Past Surgical History:  Procedure Laterality Date  . CARPAL TUNNEL RELEASE     3-4 yeras ago from 2016  . COLONOSCOPY  2010, 2016   polyp in 2010, none 2016  . 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 TONSILLECTOMY  1972    There were no vitals filed for this visit.   Subjective Assessment - 01/20/20 0950    Subjective No pain but still pain with sitting crossed legged. Maybe a little better with figure 4 sleeping/resting.    Currently in Pain? No/denies                             [facility] Adult PT Treatment/Exercise - 01/20/20 0001      Knee/Hip Exercises: Standing   Other Standing Knee Exercises sit-stand green at knees    Other Standing Knee Exercises lateral squats with green band in front of mat table 4 passes each way      Knee/Hip Exercises: Supine   Bridges 20 reps    Bridges Limitations green band      Knee/Hip Exercises: Sidelying   Hip ABduction 20 reps    Hip ABduction Limitations green band    Clams x 20   green band     Knee/Hip Exercises: Prone   Other Prone Exercises Qped donkey kicks 10 x each green fire hydrants green band x 10 each                    PT Short Term Goals - 01/14/20 1657      PT SHORT TERM GOAL #1   Title Pt will be I and compliant with initial HEP.    Time 2     Period Weeks    Status New    Target Date 01/28/20             PT Long Term Goals - 01/14/20 1658      PT LONG TERM GOAL #1   Title Pt will be independent with long term strength program for maintenance and ability to perform other physical activities.    Baseline none    Time 6    Period Weeks    Status New    Target Date 02/25/20      PT LONG TERM GOAL #2   Title Pt will increase FOTO ability to at least 81%, in order to demonstrate improvement in perceived ability.    Baseline 77%    Time 6    Period Weeks    Status New    Target Date 02/25/20      PT LONG TERM GOAL #3   Title Pt will increaes L hip  ER, abductor, and extensor MMT to at least 4+/5 for improved hip stability.    Baseline 3+ to 4-/5    Time 6    Period Weeks    Status New    Target Date 02/25/20      PT LONG TERM GOAL #4   Title Pt will report </=2/10 pain with supine lying and sitting, as needed for sleep and resting positions.    Time 6    Period Weeks    Status New    Target Date 02/25/20      PT LONG TERM GOAL #5   Title Pt will have no pain with modified yoga positions and understand appropriate mechanics for L hip hypermobility to avoid overstretching.    Time 6    Period Weeks    Status New    Target Date 02/25/20                 Plan - 01/20/20 1023    Clinical Impression Statement Pt reports she is feeling great and doing her HEP. Still some pain with figure 4/ crossed leg sitting. Reviewed HEP and progressed with green band strength in qped and standing.    PT Next Visit Plan Assess response to HEP and update PRN, measure hip ROM, progress hip stability and strength    PT Home Exercise Plan [id number]: Resisted clams, resisted bridging, sidelying hip abduction, side squats with band, qped fire hydrant green band and  donkey kicks green band           Patient will benefit from skilled therapeutic intervention in order to improve the following deficits and impairments:   Pain,Decreased strength,Hypermobility,Impaired perceived functional ability,Improper body mechanics  Visit Diagnosis: Pain in left hip  Pain in right hip  Acute bilateral low back pain without sciatica  Muscle weakness (generalized)     Problem List Patient Active Problem List   Diagnosis Date Noted  . Left hip pain 10/29/2019  . Arthritis of carpometacarpal [facility]) joint of right thumb 04/30/2019  . Trigger finger, right ring finger 11/26/2018  . Eczema 08/26/2014  . Allergic rhinitis 08/26/2014  . History of colonic polyps 04/23/2010  . Graves disease 01/24/2007    Sherrie Mustache , PTA 01/20/2020, 10:27 AM  [facility] [address] [place], [place], [address] Phone: [phone]   Fax:  [phone]  Name: Kristine [name] MRN: [id number] Date of Birth: 02-20-1955

## 2020-01-29 ENCOUNTER — Encounter: Payer: Self-pay | Admitting: Physical Therapy

## 2020-01-29 ENCOUNTER — Ambulatory Visit: Payer: Managed Care, Other (non HMO) | Admitting: Physical Therapy

## 2020-01-29 ENCOUNTER — Other Ambulatory Visit: Payer: Self-pay

## 2020-01-29 DIAGNOSIS — M545 Low back pain, unspecified: Secondary | ICD-10-CM

## 2020-01-29 DIAGNOSIS — M25551 Pain in right hip: Secondary | ICD-10-CM

## 2020-01-29 DIAGNOSIS — M25552 Pain in left hip: Secondary | ICD-10-CM | POA: Diagnosis not present

## 2020-01-29 DIAGNOSIS — M6281 Muscle weakness (generalized): Secondary | ICD-10-CM

## 2020-01-29 NOTE — Therapy (Signed)
[facility] [address] [place], [place], [address] Phone: [phone]   Fax:  [phone]  Physical Therapy Treatment  Patient Details  Name: Kristine [name] MRN: [id number] Date of Birth: 24-Oct-1955 Referring Provider (PT): Antoine Primas, DO   Encounter Date: 01/29/2020   PT End of Session - 01/29/20 1421    Visit Number 3    Number of Visits 10    Date for PT Re-Evaluation 02/25/20    PT Start Time 1420   20 minutes late   PT Stop Time 1447    PT Time Calculation (min) 27 min           Past Medical History:  Diagnosis Date  . Allergy   . GRAVE'S DISEASE 01/24/2007    Past Surgical History:  Procedure Laterality Date  . CARPAL TUNNEL RELEASE     3-4 yeras ago from 2016  . COLONOSCOPY  2010, 2016   polyp in 2010, none 2016  . 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 TONSILLECTOMY  1972    There were no vitals filed for this visit.   Subjective Assessment - 01/29/20 1420    Subjective I have been doing my exercises. I am feeling much better.              [facility] PT Assessment - 01/29/20 0001      Strength   Right Hip Extension 4+/5    Right Hip External Rotation  4+/5    Right Hip ABduction 5/5    Left Hip Extension 4/5    Left Hip External Rotation 4+/5    Left Hip ABduction 5/5                         [facility] Adult PT Treatment/Exercise - 01/29/20 0001      Knee/Hip Exercises: Standing   Other Standing Knee Exercises sit-stand green at knees   added 6lbs( goblet squat, trial of blue)   Other Standing Knee Exercises lateral squats with green band in front of mat table 4 passes each way   trial of blue band     Knee/Hip Exercises: Supine   Single Leg Bridge 20 reps   cues to keep pelvis level     Knee/Hip Exercises: Sidelying   Clams x 20   blue band                   PT Short Term Goals - 01/29/20 1421      PT SHORT TERM GOAL #1   Title Pt will be I and  compliant with initial HEP.    Time 2    Period Weeks    Status Achieved             PT Long Term Goals - 01/14/20 1658      PT LONG TERM GOAL #1   Title Pt will be independent with long term strength program for maintenance and ability to perform other physical activities.    Baseline none    Time 6    Period Weeks    Status New    Target Date 02/25/20      PT LONG TERM GOAL #2   Title Pt will increase FOTO ability to at least 81%, in order to demonstrate improvement in perceived ability.    Baseline 77%    Time 6    Period Weeks    Status New    Target Date 02/25/20  PT LONG TERM GOAL #3   Title Pt will increaes L hip ER, abductor, and extensor MMT to at least 4+/5 for improved hip stability.    Baseline 3+ to 4-/5    Time 6    Period Weeks    Status New    Target Date 02/25/20      PT LONG TERM GOAL #4   Title Pt will report </=2/10 pain with supine lying and sitting, as needed for sleep and resting positions.    Time 6    Period Weeks    Status New    Target Date 02/25/20      PT LONG TERM GOAL #5   Title Pt will have no pain with modified yoga positions and understand appropriate mechanics for L hip hypermobility to avoid overstretching.    Time 6    Period Weeks    Status New    Target Date 02/25/20                 Plan - 01/29/20 1441    Clinical Impression Statement Pt arrived 15 minutes late and needed to change clothes so session started 20 minutes late. She does report significant improvement in pain. Her MMT is much improved with more weakness evident in left hip extension. Able to progress to weighted squats.    PT Next Visit Plan Assess response to HEP and update PRN, measure hip ROM, progress hip stability and strength    PT Home Exercise Plan [id number]: Resisted clams, resisted bridging, sidelying hip abduction, side squats with band, qped fire hydrant green band and  donkey kicks green band, goblet squat and singel leg bridge            Patient will benefit from skilled therapeutic intervention in order to improve the following deficits and impairments:  Pain,Decreased strength,Hypermobility,Impaired perceived functional ability,Improper body mechanics  Visit Diagnosis: Pain in left hip  Pain in right hip  Acute bilateral low back pain without sciatica  Muscle weakness (generalized)     Problem List Patient Active Problem List   Diagnosis Date Noted  . Left hip pain 10/29/2019  . Arthritis of carpometacarpal [facility]) joint of right thumb 04/30/2019  . Trigger finger, right ring finger 11/26/2018  . Eczema 08/26/2014  . Allergic rhinitis 08/26/2014  . History of colonic polyps 04/23/2010  . Graves disease 01/24/2007    Sherrie Mustache, PTA 01/29/2020, 2:49 PM  [facility] [address] [place], [place], [address] Phone: [phone]   Fax:  [phone]  Name: ELDA DUNKERSON MRN: [id number] Date of Birth: [birth year]

## 2020-02-03 ENCOUNTER — Encounter: Payer: Self-pay | Admitting: Physical Therapy

## 2020-02-05 ENCOUNTER — Other Ambulatory Visit: Payer: Self-pay

## 2020-02-05 ENCOUNTER — Encounter: Payer: Self-pay | Admitting: Physical Therapy

## 2020-02-05 ENCOUNTER — Ambulatory Visit: Payer: Managed Care, Other (non HMO) | Admitting: Physical Therapy

## 2020-02-05 DIAGNOSIS — M545 Low back pain, unspecified: Secondary | ICD-10-CM

## 2020-02-05 DIAGNOSIS — M6281 Muscle weakness (generalized): Secondary | ICD-10-CM

## 2020-02-05 DIAGNOSIS — M25552 Pain in left hip: Secondary | ICD-10-CM

## 2020-02-05 DIAGNOSIS — M25551 Pain in right hip: Secondary | ICD-10-CM

## 2020-02-05 NOTE — Therapy (Signed)
[facility] [address] [place], [place], [address] Phone: [phone]   Fax:  [phone]  Physical Therapy Treatment  Patient Details  Name: Kristine [name] MRN: [id number] Date of Birth: [birth year] Referring Provider (PT): Antoine Primas, DO   Encounter Date: 02/05/2020   PT End of Session - 02/05/20 1415    Visit Number 4    Number of Visits 10    Date for PT Re-Evaluation 02/25/20    PT Start Time 1412    PT Stop Time 1445    PT Time Calculation (min) 33 min           Past Medical History:  Diagnosis Date  . Allergy   . GRAVE'S DISEASE 01/24/2007    Past Surgical History:  Procedure Laterality Date  . CARPAL TUNNEL RELEASE     3-4 yeras ago from 2016  . COLONOSCOPY  2010, 2016   polyp in 2010, none 2016  . 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 TONSILLECTOMY  1972    There were no vitals filed for this visit.   Subjective Assessment - 02/05/20 1414    Subjective I am still doing good.    Currently in Pain? No/denies                             [facility] Adult PT Treatment/Exercise - 02/05/20 0001      Knee/Hip Exercises: Standing   Other Standing Knee Exercises lateral squats with green band in front of mat table 4 passes each way      Knee/Hip Exercises: Supine   Bridges 20 reps   also with 10#   Bridges Limitations green band    Single Leg Bridge 20 reps   cues to keep pelvis level, also x 10 with 10#     Knee/Hip Exercises: Sidelying   Hip ABduction 20 reps    Hip ABduction Limitations green band    Clams x 20   green band     Knee/Hip Exercises: Prone   Other Prone Exercises Qped donkey kicks 10 x each green fire hydrants green band x 10 each   x2                   PT Short Term Goals - 01/29/20 1421      PT SHORT TERM GOAL #1   Title Pt will be I and compliant with initial HEP.    Time 2    Period Weeks    Status Achieved             PT Long  Term Goals - 01/14/20 1658      PT LONG TERM GOAL #1   Title Pt will be independent with long term strength program for maintenance and ability to perform other physical activities.    Baseline none    Time 6    Period Weeks    Status New    Target Date 02/25/20      PT LONG TERM GOAL #2   Title Pt will increase FOTO ability to at least 81%, in order to demonstrate improvement in perceived ability.    Baseline 77%    Time 6    Period Weeks    Status New    Target Date 02/25/20      PT LONG TERM GOAL #3   Title Pt will increaes L hip ER, abductor, and extensor MMT to at least  4+/5 for improved hip stability.    Baseline 3+ to 4-/5    Time 6    Period Weeks    Status New    Target Date 02/25/20      PT LONG TERM GOAL #4   Title Pt will report </=2/10 pain with supine lying and sitting, as needed for sleep and resting positions.    Time 6    Period Weeks    Status New    Target Date 02/25/20      PT LONG TERM GOAL #5   Title Pt will have no pain with modified yoga positions and understand appropriate mechanics for L hip hypermobility to avoid overstretching.    Time 6    Period Weeks    Status New    Target Date 02/25/20                 Plan - 02/05/20 1421    Clinical Impression Statement pt reports she was able to sit cross legged for 10 minutes without pain. Session spent reviewing entire HEP and giving cues to slow down and use eccentric control with therex. Overall she reports significant improvement. She will plan to run and attend yoga before her next appointment. If she is doing well, will likely DC to HEP.    PT Next Visit Plan FOTO< check goal, possible DC , Assess response to HEP and update PRN, measure hip ROM, progress hip stability and strength    PT Home Exercise Plan [id number]: Resisted clams, resisted bridging, sidelying hip abduction, side squats with band, qped fire hydrant green band and  donkey kicks green band, goblet squat and singel leg bridge            Patient will benefit from skilled therapeutic intervention in order to improve the following deficits and impairments:  Pain,Decreased strength,Hypermobility,Impaired perceived functional ability,Improper body mechanics  Visit Diagnosis: Pain in left hip  Pain in right hip  Acute bilateral low back pain without sciatica  Muscle weakness (generalized)     Problem List Patient Active Problem List   Diagnosis Date Noted  . Left hip pain 10/29/2019  . Arthritis of carpometacarpal [facility]) joint of right thumb 04/30/2019  . Trigger finger, right ring finger 11/26/2018  . Eczema 08/26/2014  . Allergic rhinitis 08/26/2014  . History of colonic polyps 04/23/2010  . Graves disease 01/24/2007    Sherrie Mustache, PTA 02/05/2020, 2:51 PM  [facility] [address] [place], [place], [address] Phone: [phone]   Fax:  [phone]  Name: JAELYNNE HOCKLEY MRN: [id number] Date of Birth: 1955/08/06

## 2020-02-10 ENCOUNTER — Encounter: Payer: Self-pay | Admitting: Physical Therapy

## 2020-02-10 ENCOUNTER — Other Ambulatory Visit: Payer: Self-pay

## 2020-02-10 ENCOUNTER — Ambulatory Visit: Payer: Managed Care, Other (non HMO) | Attending: Family Medicine | Admitting: Physical Therapy

## 2020-02-10 DIAGNOSIS — M6281 Muscle weakness (generalized): Secondary | ICD-10-CM | POA: Diagnosis present

## 2020-02-10 DIAGNOSIS — M545 Low back pain, unspecified: Secondary | ICD-10-CM | POA: Diagnosis present

## 2020-02-10 DIAGNOSIS — M25551 Pain in right hip: Secondary | ICD-10-CM | POA: Diagnosis present

## 2020-02-10 DIAGNOSIS — M25552 Pain in left hip: Secondary | ICD-10-CM | POA: Diagnosis not present

## 2020-02-10 NOTE — Therapy (Addendum)
[place], [place], [address] Phone: [phone]   Fax:  [phone]  Physical Therapy Treatment/Discharge  Patient Details  Name: Kristine [name] MRN: [id number] Date of Birth: 03/14/1955 Referring Provider (PT): Hulan Saas, DO   Encounter Date: 02/10/2020   PT End of Session - 02/10/20 1248    Visit Number 5    Number of Visits 10    Date for PT Re-Evaluation 02/25/20    PT Start Time 7035    PT Stop Time 0093    PT Time Calculation (min) 30 min           Past Medical History:  Diagnosis Date   Allergy    GRAVE'S DISEASE 01/24/2007    Past Surgical History:  Procedure Laterality Date   CARPAL TUNNEL RELEASE     3-4 yeras ago from 2016   COLONOSCOPY  2010, 2016   polyp in 2010, none 2016   THORACOTOMY  8182   duplicate esophagus   TONSILLECTOMY  1972    There were no vitals filed for this visit.   Subjective Assessment - 02/10/20 1244    Subjective I have a little pain after my exercises when I get in bed but otherwise              [facility] PT Assessment - 02/10/20 0001      Strength   Right Hip Extension 5/5    Left Hip Extension 4+/5    Left Hip ABduction 5/5                         [facility] Adult PT Treatment/Exercise - 02/10/20 0001      Knee/Hip Exercises: Standing   Other Standing Knee Exercises sit-stand blue at knees   added 6lbs( goblet squat, trial of blue)   Other Standing Knee Exercises lateral squats with blue band in front of mat table 4 passes each way      Knee/Hip Exercises: Supine   Bridges 20 reps   also with 10#   Bridges Limitations blue band    Single Leg Bridge 20 reps   cues to keep pelvis level, also x 10 with 10#     Knee/Hip Exercises: Sidelying   Hip ABduction 20 reps    Hip ABduction Limitations blue band    Clams x 20   band     Knee/Hip Exercises: Prone   Other Prone Exercises Qped donkey kicks 10 x each green fire  hydrants green band x 10 each   x2                   PT Short Term Goals - 01/29/20 1421      PT SHORT TERM GOAL #1   Title Pt will be I and compliant with initial HEP.    Time 2    Period Weeks    Status Achieved             PT Long Term Goals - 02/10/20 1250      PT LONG TERM GOAL #1   Title Pt will be independent with long term strength program for maintenance and ability to perform other physical activities.    Time 6    Period Weeks    Status Achieved      PT LONG TERM GOAL #2   Title Pt will increase FOTO ability to at least 81%, in order to demonstrate improvement in perceived ability.  Period Weeks    Status Achieved      PT LONG TERM GOAL #3   Title Pt will increaes L hip ER, abductor, and extensor MMT to at least 4+/5 for improved hip stability.    Baseline 4+/5 to 5/5    Time 6    Period Weeks    Status Achieved      PT LONG TERM GOAL #4   Title Pt will report </=2/10 pain with supine lying and sitting, as needed for sleep and resting positions.    Baseline 7/10 intermittent pain when first gets in bed.    Time 6    Period Weeks    Status Partially Met      PT LONG TERM GOAL #5   Title Pt will have no pain with modified yoga positions and understand appropriate mechanics for L hip hypermobility to avoid overstretching.    Baseline returned to yoga and sitting crossed leg without pain    Time 6    Period Weeks    Status Achieved                 Plan - 02/10/20 1257    Clinical Impression Statement Session started 15 minutes late. Pt reports she is ready for Discharge to HEP. She has returned to yoga and crossed leg sitting without pain. She is consistent with HEP and reports 95% improvement. Most LTGs met. FOTO sore improved to 98% function.    PT Next Visit Plan discharge to HEP    PT Home Exercise Plan [id number]: Resisted clams, resisted bridging, sidelying hip abduction, side squats with band, qped fire hydrant green band and   donkey kicks green band, goblet squat and singel leg bridge           Patient will benefit from skilled therapeutic intervention in order to improve the following deficits and impairments:  Pain,Decreased strength,Hypermobility,Impaired perceived functional ability,Improper body mechanics  Visit Diagnosis: Pain in left hip  Pain in right hip  Acute bilateral low back pain without sciatica  Muscle weakness (generalized)     Problem List Patient Active Problem List   Diagnosis Date Noted   Left hip pain 10/29/2019   Arthritis of carpometacarpal [facility]) joint of right thumb 04/30/2019   Trigger finger, right ring finger 11/26/2018   Eczema 08/26/2014   Allergic rhinitis 08/26/2014   History of colonic polyps 04/23/2010   Graves disease 01/24/2007    Dorene Ar, PTA 02/10/2020, 1:22 PM  [facility] [address] [place], [place], [address] Phone: [phone]   Fax:  [phone]  Name: Kristine [name] MRN: [id number] Date of Birth: Nov 22, 1955   PHYSICAL THERAPY DISCHARGE SUMMARY  Visits from Start of Care: 5  Current functional level related to goals / functional outcomes: Pt has been able to return to yoga and physical activity pain free.   Remaining deficits: Intermittent pain when first getting into bed   Education / Equipment: HEP, theraband  Plan: Patient agrees to discharge.  Patient goals were partially met. Patient is being discharged due to being pleased with the current functional level.  ?????    Phill Myron. Yvette Rack, PT, DPT

## 2020-02-14 LAB — HM PAP SMEAR: HM Pap smear: NEGATIVE

## 2020-02-18 ENCOUNTER — Other Ambulatory Visit: Payer: Managed Care, Other (non HMO)

## 2020-02-18 DIAGNOSIS — Z20822 Contact with and (suspected) exposure to covid-19: Secondary | ICD-10-CM

## 2020-02-20 LAB — NOVEL CORONAVIRUS, NAA: SARS-CoV-2, NAA: NOT DETECTED

## 2020-02-20 LAB — SARS-COV-2, NAA 2 DAY TAT

## 2020-03-02 ENCOUNTER — Other Ambulatory Visit: Payer: Self-pay

## 2020-03-02 ENCOUNTER — Encounter: Payer: Self-pay | Admitting: Family Medicine

## 2020-03-02 ENCOUNTER — Ambulatory Visit (INDEPENDENT_AMBULATORY_CARE_PROVIDER_SITE_OTHER): Payer: Managed Care, Other (non HMO) | Admitting: Family Medicine

## 2020-03-02 VITALS — BP 114/84 | HR 54 | Temp 98.1°F | Ht 65.0 in | Wt 123.0 lb

## 2020-03-02 DIAGNOSIS — E785 Hyperlipidemia, unspecified: Secondary | ICD-10-CM | POA: Diagnosis not present

## 2020-03-02 DIAGNOSIS — Z0000 Encounter for general adult medical examination without abnormal findings: Secondary | ICD-10-CM

## 2020-03-02 DIAGNOSIS — E0500 Thyrotoxicosis with diffuse goiter without thyrotoxic crisis or storm: Secondary | ICD-10-CM | POA: Diagnosis not present

## 2020-03-02 MED ORDER — DORZOLAMIDE HCL-TIMOLOL MAL 2-0.5 % OP SOLN: 1.0000 [drp] | Freq: Every day | OPHTHALMIC | 0 refills | Status: AC

## 2020-03-02 NOTE — Patient Instructions (Addendum)
Health Maintenance Due  Topic Date Due  . PAP SMEAR-thanks for messaging them for [place] to get records 10/21/2018   Please schedule skin cancer screening with Dr. Sharyn Lull  Thanks for doing labs today If you have mychart- we will send your results within 3 business days of [place] receiving them.  If you do not have mychart- we will call you about results within 5 business days of [place] receiving them.  *please also note that you will see labs on mychart as soon as they post. I will later go in and write notes on them- will say "notes from Dr. Durene Cal"   Recommended follow up: Return in about 1 year (around 03/02/2021) for physical or sooner if needed.

## 2020-03-02 NOTE — Progress Notes (Signed)
Phone [phone]   Subjective:  Patient presents today for their annual physical. Chief complaint-noted.   See problem oriented charting- ROS- full  review of systems was completed and negative except for: eye redness, cold intolerance, joint pain, back pain, seasonal allergies  The following were reviewed and entered/updated in epic: Past Medical History:  Diagnosis Date  . Allergy   . GRAVE'S DISEASE 01/24/2007   Patient Active Problem List   Diagnosis Date Noted  . Graves disease 01/24/2007    Priority: Medium  . Eczema 08/26/2014    Priority: Low  . Allergic rhinitis 08/26/2014    Priority: Low  . History of colonic polyps 04/23/2010    Priority: Low  . Left hip pain 10/29/2019  . Arthritis of carpometacarpal [facility]) joint of right thumb 04/30/2019  . Trigger finger, right ring finger 11/26/2018  . Glaucoma 02/08/2016   Past Surgical History:  Procedure Laterality Date  . CARPAL TUNNEL RELEASE     3-4 yeras ago from 2016  . COLONOSCOPY  2010, 2016   polyp in 2010, none 2016  . 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 TONSILLECTOMY  1972    Family History  Problem Relation Age of Onset  . Heart disease Father        50 MI- 4 stents incl. LAD, former smoker distant  . Lung disease Mother        "shock lung"  . Colon cancer Neg Hx   . Colon polyps Neg Hx   . Rectal cancer Neg Hx   . Stomach cancer Neg Hx   . Esophageal cancer Neg Hx     Medications- reviewed and updated Current Outpatient Medications  Medication Sig Dispense Refill  . Cholecalciferol (VITAMIN D3 PO) Take by mouth.    . diphenhydrAMINE (BENADRYL) 25 MG tablet Take 25 mg by mouth daily.    [name] loratadine (CLARITIN) 10 MG tablet Take 10 mg by mouth daily.    [name] Lysine 1000 MG TABS     . methimazole (TAPAZOLE) 10 MG tablet TAKE 1/2 TABLET BY MOUTH DAILY 45 tablet 3  . Misc Natural Products (TURMERIC CURCUMIN) CAPS Take 1 capsule by mouth daily.    . Multiple Vitamin (MULTIVITAMIN) tablet  Take 1 tablet by mouth daily.    . Omega-3 Fatty Acids (FISH OIL) 500 MG CAPS Take by mouth. 1200 mg daily    . Tart Cherry 1200 MG CAPS     . dorzolamide-timolol (COSOPT) 22.3-6.8 MG/ML ophthalmic solution Place 1 drop into both eyes daily. Through optho 1 mL 0   No current facility-administered medications for this visit.    Allergies-reviewed and updated No Known Allergies  Social History   Social History Narrative   Married. 43 (grandson 1159) and 31 year old daughters (grandson in late 2020) in 2016. Both live close.    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       Hobbies: read, walk dog, yoga, movies, steelers in the fall   Objective  Objective:  BP 114/84   Pulse (!) 54   Temp 98.1 F (36.7 C) (Temporal)   Ht 5\' 5"  (1.651 m)   Wt 123 lb (55.8 kg)   LMP 03/31/2010   SpO2 97%   BMI 20.47 kg/m  Gen: NAD, resting comfortably HEENT: Mask not removed due to covid 19. TM normal. Bridge of nose normal. Eyelids normal.  Neck: no thyromegaly  or cervical lymphadenopathy  CV: RRR no murmurs rubs or gallops Lungs: CTAB no crackles, wheeze, rhonchi Abdomen: soft/nontender/nondistended/normal bowel sounds. No rebound or guarding.  Ext: no edema Skin: warm, dry Neuro: grossly normal, moves all extremities, PERRLA   Assessment and Plan   65 y.o. female presenting for annual physical.  Health Maintenance counseling: 1. Anticipatory guidance: Patient counseled regarding regular dental exams -q6 months, eye exams-every 6 months with glaucoma history,  avoiding smoking and second hand smoke , limiting alcohol to 1 beverage per day-does not drink.   2. Risk factor reduction:  Advised patient of need for regular exercise and diet rich and fruits and vegetables to reduce risk of heart attack and stroke. Exercise- backed off on running due to hip pain- has worked with Dr. Katrinka Blazing. Got a spin bike and did  700 miles and did 45 mile in [place] and considering running as well, also doing some yoga and some walking. Diet-reasonably healthy diet.  Wt Readings from Last 3 Encounters:  03/02/20 123 lb (55.8 kg)  10/29/19 126 lb (57.2 kg)  09/12/19 127 lb (57.6 kg)  3. Immunizations/screenings/ancillary studies-up-to-date  Immunization History  Administered Date(s) Administered  . Influenza Whole 02/12/2009, 10/13/2009  . Influenza,inj,Quad PF,6+ Mos 01/30/2013, 12/26/2013, 10/29/2018  . Influenza-Unspecified 03/26/2011, 12/06/2015, 02/04/2017, 11/04/2017, 11/15/2019  . PFIZER(Purple Top)SARS-COV-2 Vaccination 04/29/2019, 05/27/2019, 11/30/2019  . Td 09/09/2009  . Tdap 02/09/2016  . Zoster Recombinat (Shingrix) 02/26/2018, 05/01/2018   4. Cervical cancer screening- followed by gynecology yearly-reviewed records from last Pap smear- January 7th 2022 with [facility]- she sent a message to gynecology to forward to [place] 5. Breast cancer screening-  breast exam with gynecology and mammogram 10/18/19 6. Colon cancer screening - July 2016 with 10-year follow-up planned 7. Skin cancer screening-has seen Dr. Sharyn Lull in the past- several years ago. advised regular sunscreen use. Denies worrisome, changing, or new skin lesions.  8. Birth control/STD check- postmenopausal/monogamous 9. Osteoporosis screening at 30- normal January 2018-walks regularly with good calcium vitamin D intake-perhaps recheck in 10 years -Never smoker  Status of chronic or acute concerns   #Graves' disease-update labs today.  Patient wants to continue chronic methimazole 5 mg daily.  Does not want more intervention than this.  Refill 5 mg dose as needed. Asymptomatic unless misses a dose.    #hyperlipidemia S: Medication: None Lab Results  Component Value Date   CHOL 202 (H) 02/28/2019   HDL 71.90 02/28/2019   LDLCALC 118 (H) 02/28/2019   TRIG 58.0 02/28/2019   CHOLHDL 3 02/28/2019   A/P: 10-year ASCVD risk based off last  year's labs only 2.8%-we discussed this is an low risk area under 7.5%-continue to monitor yearly and focus on healthy eating/regular exercise maintenance    #Eczema-no recent issues   #Seasonal allergies-worse in the spring-okay at the moment.  Uses Claritin as needed.  Has allergy eyedrops as well from eye doctor  -avoids bees- does not have epi pen  #Glaucoma-reasonably controlled-maintained on eye drops  #Arthritis-in her thumb on the right side. Also trigger finger on right hand ok with wearing brace. Dr. Katrinka Blazing said possible gout. voltaren gel- she doesn't like it- wears brace and it helps  #Lower back pain-has not talked to Dr. Katrinka Blazing yet about this. She has been spinning a lot since got bike for Christmas. Shorter bike than spin class- thinks she needs to adjust handles. Can chat with Dr. Katrinka Blazing if fails to improve.    # hip pain better after PT- does some strength  training and this seems to really help using bands  Recommended follow up: Return in about 1 year (around 03/02/2021) for physical or sooner if needed.  Lab/Order associations:  NOT fasting   ICD-10-CM   1. Preventative health care  Z00.00 CBC with Differential/Platelet    Comprehensive metabolic panel    Lipid panel    TSH    T4, free    T3, free  2. Graves disease  E05.00 TSH    T4, free    T3, free  3. Hyperlipidemia, unspecified hyperlipidemia type  E78.5 CBC with Differential/Platelet    Comprehensive metabolic panel    Lipid panel   Meds ordered this encounter  Medications  . dorzolamide-timolol (COSOPT) 22.3-6.8 MG/ML ophthalmic solution    Sig: Place 1 drop into both eyes daily. Through optho    Dispense:  1 mL    Refill:  0    Return precautions advised.  Tana Conch, MD

## 2020-03-03 LAB — CBC WITH DIFFERENTIAL/PLATELET
Basophils Absolute: 0.1 10*3/uL (ref 0.0–0.1)
Basophils Relative: 1.2 % (ref 0.0–3.0)
Eosinophils Absolute: 0.1 10*3/uL (ref 0.0–0.7)
Eosinophils Relative: 1.8 % (ref 0.0–5.0)
HCT: 42.2 % (ref 36.0–46.0)
Hemoglobin: 14.5 g/dL (ref 12.0–15.0)
Lymphocytes Relative: 37.2 % (ref 12.0–46.0)
Lymphs Abs: 2.5 10*3/uL (ref 0.7–4.0)
MCHC: 34.5 g/dL (ref 30.0–36.0)
MCV: 93.5 fl (ref 78.0–100.0)
Monocytes Absolute: 0.5 10*3/uL (ref 0.1–1.0)
Monocytes Relative: 7 % (ref 3.0–12.0)
Neutro Abs: 3.5 10*3/uL (ref 1.4–7.7)
Neutrophils Relative %: 52.8 % (ref 43.0–77.0)
Platelets: 223 10*3/uL (ref 150.0–400.0)
RBC: 4.51 Mil/uL (ref 3.87–5.11)
RDW: 12.5 % (ref 11.5–15.5)
WBC: 6.7 10*3/uL (ref 4.0–10.5)

## 2020-03-03 LAB — COMPREHENSIVE METABOLIC PANEL
ALT: 17 U/L (ref 0–35)
AST: 29 U/L (ref 0–37)
Albumin: 4.5 g/dL (ref 3.5–5.2)
Alkaline Phosphatase: 67 U/L (ref 39–117)
BUN: 14 mg/dL (ref 6–23)
CO2: 26 mEq/L (ref 19–32)
Calcium: 9.9 mg/dL (ref 8.4–10.5)
Chloride: 104 mEq/L (ref 96–112)
Creatinine, Ser: 0.71 mg/dL (ref 0.40–1.20)
GFR: 91.3 mL/min (ref >60.00)
Glucose, Bld: 78 mg/dL (ref 70–99)
Potassium: 3.9 mEq/L (ref 3.5–5.1)
Sodium: 139 mEq/L (ref 135–145)
Total Bilirubin: 0.7 mg/dL (ref 0.2–1.2)
Total Protein: 6.8 g/dL (ref 6.0–8.3)

## 2020-03-03 LAB — T4, FREE: Free T4: 0.81 ng/dL (ref 0.60–1.60)

## 2020-03-03 LAB — LIPID PANEL
Cholesterol: 189 mg/dL (ref 0–200)
HDL: 63 mg/dL (ref >39.00)
LDL Cholesterol: 114 mg/dL — ABNORMAL HIGH (ref 0–99)
NonHDL: 126.17
Total CHOL/HDL Ratio: 3
Triglycerides: 62 mg/dL (ref 0.0–149.0)
VLDL: 12.4 mg/dL (ref 0.0–40.0)

## 2020-03-03 LAB — TSH: TSH: 1.66 u[IU]/mL (ref 0.35–4.50)

## 2020-03-03 LAB — T3, FREE: T3, Free: 2.7 pg/mL (ref 2.3–4.2)

## 2020-04-21 ENCOUNTER — Other Ambulatory Visit: Payer: Self-pay | Admitting: Family Medicine

## 2020-05-04 NOTE — Progress Notes (Unsigned)
[name] [address] [place] [address] Phone: [phone] Subjective:   Kristine [name], am serving as a scribe for Dr. Antoine Primas. This visit occurred during the SARS-CoV-2 public health emergency.  Safety protocols were in place, including screening questions prior to the visit, additional usage of staff PPE, and extensive cleaning of exam room while observing appropriate contact time as indicated for disinfecting solutions.   I'm seeing this patient by the request  of:  [name], MD  CC: finger pain folllow up   [id number]   09/12/2019 Repeat injection given today, tolerated the procedure well, discussed icing regimen and home exercise, which activities to do which wants to avoid.  Patient is to increase activity slowly.  Bracing at night.  If more than 3 injections in a 69-month.  Would consider referral to surgical intervention  Update 05/05/2020 Kristine [name] is a 65 y.o. female coming in with complaint of right  hand trigger ring finger, left hand middle finger, R CMC joint pain and low back pain with radiating symptoms on R>L. Back pain is worse at night and she is using pillow to help with pain. Did do PT for hips which helped alleviate her hip pain. Patient has been using splint on all of the affected fingers at night.       Past Medical History:  Diagnosis Date  . Allergy   . GRAVE'S DISEASE 01/24/2007   Past Surgical History:  Procedure Laterality Date  . CARPAL TUNNEL RELEASE     3-4 yeras ago from 2016  . COLONOSCOPY  2010, 2016   polyp in 2010, none 2016  . 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 TONSILLECTOMY  1972   Social History   Socioeconomic History  . Marital status: Married    Spouse name: Not on file  . Number of children: Not on file  . Years of education: Not on file  . Highest education level: Not on file  Occupational History  . Not on file  Tobacco Use  . Smoking status: Never  Smoker  . Smokeless tobacco: Never Used  Substance and Sexual Activity  . Alcohol use: No    Alcohol/week: 0.0 standard drinks  . Drug use: No  . Sexual activity: Not on file  Other Topics Concern  . Not on file  Social History Narrative   Married. 76 (grandson [phone]) and 65 year old daughters (grandson in late 2020) in 2016. Both live close.    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       Hobbies: read, walk dog, yoga, movies, steelers in the fall   Social Determinants of Health   Financial Resource Strain: Not on file  Food Insecurity: Not on file  Transportation Needs: Not on file  Physical Activity: Not on file  Stress: Not on file  Social Connections: Not on file   No Known Allergies Family History  Problem Relation Age of Onset  . Heart disease Father        9 MI- 4 stents incl. LAD, former smoker distant  . Lung disease Mother        "shock lung"  . Colon cancer Neg Hx   . Colon polyps Neg Hx   . Rectal cancer Neg Hx   . Stomach cancer Neg Hx   . Esophageal cancer Neg Hx  Current Outpatient Medications (Endocrine & Metabolic):  .  methimazole (TAPAZOLE) 10 MG tablet, TAKE 1/2 TABLET BY MOUTH DAILY   Current Outpatient Medications (Respiratory):  .  diphenhydrAMINE (BENADRYL) 25 MG tablet, Take 25 mg by mouth daily. [name]  loratadine (CLARITIN) 10 MG tablet, Take 10 mg by mouth daily.    Current Outpatient Medications (Other):  [name]  Cholecalciferol (VITAMIN D3 PO), Take by mouth. .  dorzolamide-timolol (COSOPT) 22.3-6.8 MG/ML ophthalmic solution, Place 1 drop into both eyes daily. Through optho .  Lysine 1000 MG TABS,  .  Misc Natural Products (TURMERIC CURCUMIN) CAPS, Take 1 capsule by mouth daily. .  Multiple Vitamin (MULTIVITAMIN) tablet, Take 1 tablet by mouth daily. .  Omega-3 Fatty Acids (FISH OIL) 500 MG CAPS, Take by mouth. 1200 mg daily .  Tart Cherry 1200 MG CAPS,     Reviewed prior external information including notes and imaging from  primary care provider As well as notes that were available from care everywhere and other healthcare systems.  Past medical history, social, surgical and family history all reviewed in electronic medical record.  No pertanent information unless stated regarding to the chief complaint.   Review of Systems:  No headache, visual changes, nausea, vomiting, diarrhea, constipation, dizziness, abdominal pain, skin rash, fevers, chills, night sweats, weight loss, swollen lymph nodes, body aches, joint swelling, chest pain, shortness of breath, mood changes. POSITIVE muscle aches  Objective  Blood pressure 120/72, height 5\' 5"  (1.651 m), weight 127 lb (57.6 kg), last menstrual period 03/31/2010.   General: No apparent distress alert and oriented x3 mood and affect normal, dressed appropriately.  HEENT: Pupils equal, extraocular movements intact  Respiratory: Patient's speak in full sentences and does not appear short of breath  Cardiovascular: No lower extremity edema, non tender, no erythema  Gait normal with good balance and coordination.  MSK: Right hand exam shows the patient does have the trigger nodule noted of the ring finger on the right hand.  No triggering noted but tender to palpation at the A2 pulley Patient does have some arthritic changes of the [facility] joint with a positive grind test noted.  No significant atrophy of the thenar eminence. Left hand exam a cyst noted at at the fingertip palmar crease small no skin changes, likely ganglion cyst Low back exam tender to palpation over the iliolumbar ligament on the right side.  Patient has some very mild worsening pain with extension of her back.  Negative straight leg test.  Negative FABER test.  Pain more with rotation of the back but does have full range of motion.  Full strength of the lower extremities.   Procedure: Real-time Ultrasound Guided Injection of left ring  flexor tendon sheath Device: GE Logiq Q7 Ultrasound guided injection is preferred based studies that show increased duration, increased effect, greater accuracy, decreased procedural pain, increased response rate, and decreased cost with ultrasound guided versus blind injection.  Verbal informed consent obtained.  Time-out conducted.  Noted no overlying erythema, induration, or other signs of local infection.  Skin prepped in a sterile fashion.  Local anesthesia: Topical Ethyl chloride.  With sterile technique and under real time ultrasound guidance: With a 25-gauge half inch needle injected with 0.5 cc of 0.5% Marcaine and 0.5 cc of Kenalog 40 mg/mL within the tendon sheath. Completed without difficulty  Pain immediately improved suggesting accurate placement of the medication.  Advised to call if fevers/chills, erythema, induration, drainage, or persistent bleeding.  Impression: Technically successful ultrasound guided injection.  Impression and Recommendations:     The above documentation has been reviewed and is accurate and complete Lyndal Pulley, DO

## 2020-05-05 ENCOUNTER — Ambulatory Visit (INDEPENDENT_AMBULATORY_CARE_PROVIDER_SITE_OTHER): Payer: Managed Care, Other (non HMO)

## 2020-05-05 ENCOUNTER — Other Ambulatory Visit: Payer: Self-pay

## 2020-05-05 ENCOUNTER — Ambulatory Visit (INDEPENDENT_AMBULATORY_CARE_PROVIDER_SITE_OTHER): Payer: Managed Care, Other (non HMO) | Admitting: Family Medicine

## 2020-05-05 ENCOUNTER — Encounter: Payer: Self-pay | Admitting: Family Medicine

## 2020-05-05 ENCOUNTER — Ambulatory Visit: Payer: Self-pay

## 2020-05-05 VITALS — BP 120/72 | Ht 65.0 in | Wt 127.0 lb

## 2020-05-05 DIAGNOSIS — M79641 Pain in right hand: Secondary | ICD-10-CM | POA: Diagnosis not present

## 2020-05-05 DIAGNOSIS — M6530 Trigger finger, unspecified finger: Secondary | ICD-10-CM | POA: Diagnosis not present

## 2020-05-05 DIAGNOSIS — M1811 Unilateral primary osteoarthritis of first carpometacarpal joint, right hand: Secondary | ICD-10-CM

## 2020-05-05 DIAGNOSIS — M545 Low back pain, unspecified: Secondary | ICD-10-CM | POA: Insufficient documentation

## 2020-05-05 DIAGNOSIS — M65341 Trigger finger, right ring finger: Secondary | ICD-10-CM

## 2020-05-05 NOTE — Assessment & Plan Note (Signed)
Stable overall.  Patient has been diagnosed with this previously.  X-rays ordered today.  We will continue to monitor but patient does not want to do anything more aggressive.

## 2020-05-05 NOTE — Assessment & Plan Note (Signed)
Patient is having pain that seems to be more iliolumbar and nature.  Discussed icing regimen and home exercises, discussed which activities to do which wants to avoid.  Increase activity slowly.  Patient knows we can consider the possibility of formal physical therapy if necessary but I believe patient will do relatively well.  X-rays pending.  No change in medication.  Follow-up again in 6 weeks

## 2020-05-05 NOTE — Patient Instructions (Signed)
Injected today Xray today Arthritis of thumb R Back exercises Avoid upward dog See me in 6 weeks

## 2020-05-05 NOTE — Assessment & Plan Note (Signed)
Repeat injection given again today.  Patient has had the rehab in 2 years.  Hopefully patient will continue to do relatively well.  Continue bracing at night, home exercises, increase activity slowly.  Follow-up again 6 to 8 weeks

## 2020-06-15 NOTE — Progress Notes (Signed)
[name] [address] [place] [address] Phone: [phone] Subjective:   Kristine [name], am serving as a scribe for Dr. Antoine Primas. This visit occurred during the SARS-CoV-2 public health emergency.  Safety protocols were in place, including screening questions prior to the visit, additional usage of staff PPE, and extensive cleaning of exam room while observing appropriate contact time as indicated for disinfecting solutions.   I'm seeing this patient by the request  of:  [name], MD  CC: multiple complaint follow ups  [id number]   05/05/2020 Patient is having pain that seems to be more iliolumbar and nature.  Discussed icing regimen and home exercises, discussed which activities to do which wants to avoid.  Increase activity slowly.  Patient knows we can consider the possibility of formal physical therapy if necessary but I believe patient will do relatively well.  X-rays pending.  No change in medication.  Follow-up again in 6 weeks  Stable overall.  Patient has been diagnosed with this previously.  X-rays ordered today.  We will continue to monitor but patient does not want to do anything more aggressive.  Repeat injection given again today.  Patient has had the rehab in 2 years.  Hopefully patient will continue to do relatively well.  Continue bracing at night, home exercises, increase activity slowly.  Follow-up again 6 to 8 weeks  Update 06/16/2020 Kristine [name] is a 65 y.o. female coming in with complaint of LBP, R CMC, and R ring finger pain. Patient states that her back is doing well. Over the weekend she had pain in R ITB. Foam rolled it before bed to help with pain.   Trigger finger is doing much better following injection.   Would like to discuss aspiration of ganglion cyst on palmer side of L hand.   Xray 05/05/2020 R hand IMPRESSION: 1. Tiny bony density noted adjacent to the ulnar styloid. This could represent a  tiny fracture fragment, age undetermined. No other acute bony abnormalities identified.  2. Mild radiocarpal, first carpometacarpal, and first distal interphalangeal joint degenerative change. Mild periarticular osteopenia noted, however there are no periarticular erosive changes Present.  Xray 05/05/2020 Lumbar IMPRESSION: Lumbosacral spondylosis, without acute osseous abnormality.      Past Medical History:  Diagnosis Date  . Allergy   . GRAVE'S DISEASE 01/24/2007   Past Surgical History:  Procedure Laterality Date  . CARPAL TUNNEL RELEASE     3-4 yeras ago from 2016  . COLONOSCOPY  2010, 2016   polyp in 2010, none 2016  . 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 TONSILLECTOMY  1972   Social History   Socioeconomic History  . Marital status: Married    Spouse name: Not on file  . Number of children: Not on file  . Years of education: Not on file  . Highest education level: Not on file  Occupational History  . Not on file  Tobacco Use  . Smoking status: Never Smoker  . Smokeless tobacco: Never Used  Substance and Sexual Activity  . Alcohol use: No    Alcohol/week: 0.0 standard drinks  . Drug use: No  . Sexual activity: Not on file  Other Topics Concern  . Not on file  Social History Narrative   Married. 74 (grandson 6459) and 48 year old daughters (grandson in late 2020) in 2016. Both live close.    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       Hobbies: read, walk dog, yoga, movies, steelers in the fall   Social Determinants of Health   Financial Resource Strain: Not on file  Food Insecurity: Not on file  Transportation Needs: Not on file  Physical Activity: Not on file  Stress: Not on file  Social Connections: Not on file   No Known Allergies Family History  Problem Relation Age of Onset  . Heart disease Father        80 MI- 4 stents incl. LAD, former smoker  distant  . Lung disease Mother        "shock lung"  . Colon cancer Neg Hx   . Colon polyps Neg Hx   . Rectal cancer Neg Hx   . Stomach cancer Neg Hx   . Esophageal cancer Neg Hx     Current Outpatient Medications (Endocrine & Metabolic):  .  methimazole (TAPAZOLE) 10 MG tablet, TAKE 1/2 TABLET BY MOUTH DAILY   Current Outpatient Medications (Respiratory):  .  diphenhydrAMINE (BENADRYL) 25 MG tablet, Take 25 mg by mouth daily. [name]  loratadine (CLARITIN) 10 MG tablet, Take 10 mg by mouth daily.    Current Outpatient Medications (Other):  [name]  Cholecalciferol (VITAMIN D3 PO), Take by mouth. .  dorzolamide-timolol (COSOPT) 22.3-6.8 MG/ML ophthalmic solution, Place 1 drop into both eyes daily. Through optho .  Lysine 1000 MG TABS,  .  Misc Natural Products (TURMERIC CURCUMIN) CAPS, Take 1 capsule by mouth daily. .  Multiple Vitamin (MULTIVITAMIN) tablet, Take 1 tablet by mouth daily. .  Omega-3 Fatty Acids (FISH OIL) 500 MG CAPS, Take by mouth. 1200 mg daily .  Tart Cherry 1200 MG CAPS,    Reviewed prior external information including notes and imaging from  primary care provider As well as notes that were available from care everywhere and other healthcare systems.  Past medical history, social, surgical and family history all reviewed in electronic medical record.  No pertanent information unless stated regarding to the chief complaint.   Review of Systems:  No headache, visual changes, nausea, vomiting, diarrhea, constipation, dizziness, abdominal pain, skin rash, fevers, chills, night sweats, weight loss, swollen lymph nodes, body aches, joint swelling, chest pain, shortness of breath, mood changes. POSITIVE muscle aches  Objective  Blood pressure 104/76, pulse 71, height 5\' 5"  (1.651 m), weight 127 lb (57.6 kg), last menstrual period 03/31/2010, SpO2 99 %.   General: No apparent distress alert and oriented x3 mood and affect normal, dressed appropriately.  HEENT: Pupils equal,  extraocular movements intact  Respiratory: Patient's speak in full sentences and does not appear short of breath  Cardiovascular: No lower extremity edema, non tender, no erythema  Gait normal with good balance and coordination.  MSK: Low back exam does have some loss of lordosis.  Some very mild pain with with extension of the back.  Patient denies any pain with straight leg test.  Does have tightness noted on the right side of the hip.  Very tightness noted with FABER test bilaterally but greater on the left.  Right hand shows no significant triggering at this time.  Left hand shows the patient does have at the proximal crease of the middle finger a cyst noted.  Tender to palpation and freely movable.  Limited musculoskeletal ultrasound was performed and interpreted 04/02/2010  Limited ultrasound of patient's third finger in the area of the cyst formation noted which is hypoechoic.  No significant abnormal blood flow.  Seems to be more superficial than the tendon sheath.  Consistent with either a inclusion cyst or more likely ganglion cyst. Impression: Cyst of finger   Impression and Recommendations:     The above documentation has been reviewed and is accurate and complete Judi Saa, DO

## 2020-06-16 ENCOUNTER — Encounter: Payer: Self-pay | Admitting: Family Medicine

## 2020-06-16 ENCOUNTER — Ambulatory Visit: Payer: Self-pay

## 2020-06-16 ENCOUNTER — Other Ambulatory Visit: Payer: Self-pay

## 2020-06-16 ENCOUNTER — Ambulatory Visit (INDEPENDENT_AMBULATORY_CARE_PROVIDER_SITE_OTHER): Payer: Managed Care, Other (non HMO) | Admitting: Family Medicine

## 2020-06-16 VITALS — BP 104/76 | HR 71 | Ht 65.0 in | Wt 127.0 lb

## 2020-06-16 DIAGNOSIS — M545 Low back pain, unspecified: Secondary | ICD-10-CM

## 2020-06-16 DIAGNOSIS — M67442 Ganglion, left hand: Secondary | ICD-10-CM | POA: Diagnosis not present

## 2020-06-16 DIAGNOSIS — M65341 Trigger finger, right ring finger: Secondary | ICD-10-CM | POA: Diagnosis not present

## 2020-06-16 NOTE — Assessment & Plan Note (Signed)
Patient's x-rays do show the patient does have some facet arthropathy.  I do think that this could be contributing to some of the leg pain.  Patient is to increase activity very slowly and continue the exercises.  Do believe that patient is actually doing the stretches too much.  Patient will not take hiatus from the fall exercise routine and decrease some of the activity and see how she responds.  Follow-up with me again in 2 to 3 months

## 2020-06-16 NOTE — Patient Instructions (Signed)
Continue to watch cyst If worsens will do injection If ITB worsens, call me Decreasing night time exercises to 10 min will help Have appt in 2-3 months but if perfect see me when you need me

## 2020-06-16 NOTE — Assessment & Plan Note (Signed)
Patient is doing really well at this moment.  No need to make any other significant changes.  Patient will increase activity slowly and follow-up with me as needed

## 2020-06-16 NOTE — Assessment & Plan Note (Signed)
Noted, 3rd finger, flexor level if worsening consider injection

## 2020-09-01 ENCOUNTER — Encounter: Payer: Self-pay | Admitting: Family Medicine

## 2020-09-01 ENCOUNTER — Other Ambulatory Visit: Payer: Self-pay

## 2020-09-01 ENCOUNTER — Ambulatory Visit (INDEPENDENT_AMBULATORY_CARE_PROVIDER_SITE_OTHER): Payer: Managed Care, Other (non HMO) | Admitting: Family Medicine

## 2020-09-01 ENCOUNTER — Ambulatory Visit: Payer: Self-pay

## 2020-09-01 VITALS — BP 116/86 | HR 53 | Ht 65.0 in | Wt 126.0 lb

## 2020-09-01 DIAGNOSIS — M1811 Unilateral primary osteoarthritis of first carpometacarpal joint, right hand: Secondary | ICD-10-CM

## 2020-09-01 DIAGNOSIS — M79641 Pain in right hand: Secondary | ICD-10-CM

## 2020-09-01 DIAGNOSIS — M65341 Trigger finger, right ring finger: Secondary | ICD-10-CM

## 2020-09-01 DIAGNOSIS — M67442 Ganglion, left hand: Secondary | ICD-10-CM

## 2020-09-01 DIAGNOSIS — M79642 Pain in left hand: Secondary | ICD-10-CM | POA: Diagnosis not present

## 2020-09-01 DIAGNOSIS — M545 Low back pain, unspecified: Secondary | ICD-10-CM

## 2020-09-01 NOTE — Assessment & Plan Note (Signed)
Facet arthritis.  Given different exercises.  Can consider the possibility of manipulation if necessary at follow-up.

## 2020-09-01 NOTE — Progress Notes (Signed)
[name] [address] [place] [address] Phone: [phone] Subjective:   Kristine [name], am serving as a scribe for Dr. Antoine Primas.  This visit occurred during the SARS-CoV-2 public health emergency.  Safety protocols were in place, including screening questions prior to the visit, additional usage of staff PPE, and extensive cleaning of exam room while observing appropriate contact time as indicated for disinfecting solutions.    I'm seeing this patient by the request  of:  [name], MD  CC: Finger pain follow-up  [id number]  06/16/2020 Noted, 3rd finger, flexor level if worsening consider injection   Patient's x-rays do show the patient does have some facet arthropathy.  I do think that this could be contributing to some of the leg pain.  Patient is to increase activity very slowly and continue the exercises.  Do believe that patient is actually doing the stretches too much.  Patient will not take hiatus from the fall exercise routine and decrease some of the activity and see how she responds.  Follow-up with me again in 2 to 3 months  Patient is doing really well at this moment.  No need to make any other significant changes.  Patient will increase activity slowly and follow-up with me as needed  Update 09/01/2020 Kristine [name] is a 65 y.o. female coming in with complaint of B hand and LBP. Patient states that her trigger finger R ring finger is getting worse.   Cyst over L index finger has increased and pain is worse.   Has been using squeeze ball to strengthen but notes weakness still in both hands.   Patient did have a right ring finger trigger finger injection in March 2022  Patient has been doing R hip exercises daily. Compression of R hip flexor in seated position makes her pain worse. Pain in R lateral upper thigh is intermittent and typically occurring at night. No pain during activity.      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 51 (grandson [id number]) and 41 year old daughters (grandson in late 2020) in 2016. Both live close.    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       Hobbies: read, walk dog, yoga, movies, steelers in the fall   Social Determinants of Health   Financial Resource Strain: Not on file  Food Insecurity: Not on file  Transportation  Needs: Not on file  Physical Activity: Not on file  Stress: Not on file  Social Connections: Not on file   No Known Allergies Family History  Problem Relation Age of Onset   Heart disease Father        38 MI- 4 stents incl. LAD, former smoker distant   Lung disease Mother        "shock lung"   Colon cancer Neg Hx    Colon polyps Neg Hx    Rectal cancer Neg Hx    Stomach cancer Neg Hx    Esophageal cancer Neg Hx     Current Outpatient Medications (Endocrine & Metabolic):    methimazole (TAPAZOLE) 10 MG tablet, TAKE 1/2 TABLET BY MOUTH DAILY   Current Outpatient Medications (Respiratory):    diphenhydrAMINE (BENADRYL) 25 MG tablet, Take 25 mg by mouth daily.   loratadine  (CLARITIN) 10 MG tablet, Take 10 mg by mouth daily.    Current Outpatient Medications (Other):    Cholecalciferol (VITAMIN D3 PO), Take by mouth.   dorzolamide-timolol (COSOPT) 22.3-6.8 MG/ML ophthalmic solution, Place 1 drop into both eyes daily. Through optho   Lysine 1000 MG TABS,    Misc Natural Products (TURMERIC CURCUMIN) CAPS, Take 1 capsule by mouth daily.   Multiple Vitamin (MULTIVITAMIN) tablet, Take 1 tablet by mouth daily.   Omega-3 Fatty Acids (FISH OIL) 500 MG CAPS, Take by mouth. 1200 mg daily   Tart Cherry 1200 MG CAPS,    Reviewed prior external information including notes and imaging from  primary care provider As well as notes that were available from care everywhere and other healthcare systems.  Past medical history, social, surgical and family history all reviewed in electronic medical record.  No pertanent information unless stated regarding to the chief complaint.   Review of Systems:  No headache, visual changes, nausea, vomiting, diarrhea, constipation, dizziness, abdominal pain, skin rash, fevers, chills, night sweats, weight loss, swollen lymph nodes, body aches, joint swelling, chest pain, shortness of breath, mood changes. POSITIVE muscle aches  Objective  Blood pressure 116/86, pulse (!) 53, height 5\' 5"  (1.651 m), weight 126 lb (57.2 kg), last menstrual period 03/31/2010, SpO2 98 %.   General: No apparent distress alert and oriented x3 mood and affect normal, dressed appropriately.  HEENT: Pupils equal, extraocular movements intact  Respiratory: Patient's speak in full sentences and does not appear short of breath  Cardiovascular: No lower extremity edema, non tender, no erythema  Gait normal with good balance and coordination.  MSK: Right hand ring finger has an tender area over the A2 pulley noted.  No triggering noted today Left middle finger shows the patient still has the cystic area right at the MP joint.  Proximally.   Procedure: Real-time  Ultrasound Guided Injection of right flexor tendon sheath of the ring finger Device: GE Logiq Q7 Ultrasound guided injection is preferred based studies that show increased duration, increased effect, greater accuracy, decreased procedural pain, increased response rate, and decreased cost with ultrasound guided versus blind injection.  Verbal informed consent obtained.  Time-out conducted.  Noted no overlying erythema, induration, or other signs of local infection.  Skin prepped in a sterile fashion.  Local anesthesia: Topical Ethyl chloride.  With sterile technique and under real time ultrasound guidance: With a 25-gauge half inch needle injected with 0.5 cc of 0.5% Marcaine and 0.5 cc of Kenalog 40 mg/mL Completed without difficulty  Pain immediately resolved suggesting accurate placement of the medication.  Advised to call if  fevers/chills, erythema, induration, drainage, or persistent bleeding.  Impression: Technically successful ultrasound guided injection.  Procedure: Real-time Ultrasound Guided Injection of left middle finger cyst Device: GE Logiq Q7 Ultrasound guided injection is preferred based studies that show increased duration, increased effect, greater accuracy, decreased procedural pain, increased response rate, and decreased cost with ultrasound guided versus blind injection.  Verbal informed consent obtained.  Time-out conducted.  Noted no overlying erythema, induration, or other signs of local infection.  Skin prepped in a sterile fashion.  Local anesthesia: Topical Ethyl chloride.  With sterile technique and under real time ultrasound guidance: With a 25-gauge half inch needle injected with 0.5 cc of 0.5% Marcaine and 0.5 cc of Kenalog 40 mg/mL Completed without difficulty  Pain immediately resolved suggesting accurate placement of the medication.  Advised to call if fevers/chills, erythema, induration, drainage, or persistent bleeding.  Impression: Technically successful  ultrasound guided injection.   Impression and Recommendations:     The above documentation has been reviewed and is accurate and complete Judi Saa, DO

## 2020-09-01 NOTE — Patient Instructions (Signed)
Trigger finger injection  Cyst injection Facet arthritis of back Stay active See me again in 6 weeks

## 2020-09-01 NOTE — Assessment & Plan Note (Signed)
Patient given injection today.  Tolerated the procedure well.  Discussed icing regimen and home exercises.  Discussed with patient about avoiding repetitive gripping in this area.

## 2020-09-01 NOTE — Assessment & Plan Note (Signed)
Chronic problem with exacerbation.  Injection given today.  Tolerated the procedure well.  Discussed icing regimen and home exercises.  Increase activity slowly.  Follow-up again in 6 weeks

## 2020-09-01 NOTE — Assessment & Plan Note (Signed)
Significant arthritic changes noted.  We will continue to monitor.

## 2020-10-12 NOTE — Progress Notes (Signed)
[name] [address] [place] [address] Phone: [phone] Subjective:   [name], am serving as a scribe for Dr. Antoine Primas. This visit occurred during the SARS-CoV-2 public health emergency.  Safety protocols were in place, including screening questions prior to the visit, additional usage of staff PPE, and extensive cleaning of exam room while observing appropriate contact time as indicated for disinfecting solutions.   I'm seeing this patient by the request  of:  [name], MD  CC:   [id number]  09/01/2020 Facet arthritis.  Given different exercises.  Can consider the possibility of manipulation if necessary at follow-up.  Chronic problem with exacerbation.  Injection given today.  Tolerated the procedure well.  Discussed icing regimen and home exercises.  Increase activity slowly.  Follow-up again in 6 weeks  Patient given injection today.  Tolerated the procedure well.  Discussed icing regimen and home exercises.  Discussed with patient about avoiding repetitive gripping in this area.  Updated 10/13/2020 Kristine [name] is a 65 y.o. female coming in with complaint of right hip pain. Cyst in left hand has not returned, trigger finger in right hand feels great.  Patient continues on to have the right hip pain.  Seems to actually have associated back pain with it.  Patient has been doing stretches but continues to have discomfort and pain.  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 74 (grandson 3166) and 37 year old daughters (grandson in late 2020) in 2016. Both live close.    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       Hobbies: read, walk dog, yoga, movies, steelers in the fall   Social Determinants of Health   Financial Resource Strain: Not on file  Food Insecurity: Not on file  Transportation Needs: Not on file  Physical Activity: Not on file  Stress: Not on file  Social Connections: Not on file   No Known Allergies Family History  Problem Relation Age of Onset   Heart disease Father        51 MI- 4 stents incl. LAD, former smoker distant   Lung disease Mother        "shock lung"   Colon cancer Neg Hx    Colon polyps Neg Hx  Rectal cancer Neg Hx    Stomach cancer Neg Hx    Esophageal cancer Neg Hx     Current Outpatient Medications (Endocrine & Metabolic):    methimazole (TAPAZOLE) 10 MG tablet, TAKE 1/2 TABLET BY MOUTH DAILY   Current Outpatient Medications (Respiratory):    diphenhydrAMINE (BENADRYL) 25 MG tablet, Take 25 mg by mouth daily.   loratadine (CLARITIN) 10 MG tablet, Take 10 mg by mouth daily.    Current Outpatient Medications (Other):    Cholecalciferol (VITAMIN D3 PO), Take by mouth.   dorzolamide-timolol (COSOPT) 22.3-6.8 MG/ML ophthalmic solution, Place 1 drop into both eyes daily. Through optho   Lysine 1000 MG TABS,    Misc Natural Products (TURMERIC CURCUMIN) CAPS, Take 1 capsule by mouth daily.   Multiple  Vitamin (MULTIVITAMIN) tablet, Take 1 tablet by mouth daily.   Omega-3 Fatty Acids (FISH OIL) 500 MG CAPS, Take by mouth. 1200 mg daily   Tart Cherry 1200 MG CAPS,    Reviewed prior external information including notes and imaging from  primary care provider As well as notes that were available from care everywhere and other healthcare systems.  Past medical history, social, surgical and family history all reviewed in electronic medical record.  No pertanent information unless stated regarding to the chief complaint.   Review of Systems:  No headache, visual changes, nausea, vomiting, diarrhea, constipation, dizziness, abdominal pain, skin rash, fevers, chills, night sweats, weight loss, swollen lymph nodes, body aches, joint swelling, chest pain, shortness of breath, mood changes. POSITIVE muscle aches  Objective  Blood pressure 106/64, pulse (!) 56, height 5\' 5"  (1.651 m), weight 126 lb (57.2 kg), last menstrual period 03/31/2010, SpO2 96 %.   General: No apparent distress alert and oriented x3 mood and affect normal, dressed appropriately.  HEENT: Pupils equal, extraocular movements intact  Respiratory: Patient's speak in full sentences and does not appear short of breath  Cardiovascular: No lower extremity edema, non tender, no erythema  Gait normal with good balance and coordination.  MSK: Patient does have some tenderness noted.  Seems to be in the lower back right side.  Questionable radicular symptoms noted with any type of extension greater than 10 degrees as well as may be potentially straight leg test that is positive as well.    Impression and Recommendations:     The above documentation has been reviewed and is accurate and complete 04/02/2010, DO

## 2020-10-13 ENCOUNTER — Ambulatory Visit (INDEPENDENT_AMBULATORY_CARE_PROVIDER_SITE_OTHER): Payer: Managed Care, Other (non HMO) | Admitting: Family Medicine

## 2020-10-13 ENCOUNTER — Encounter: Payer: Self-pay | Admitting: Family Medicine

## 2020-10-13 ENCOUNTER — Other Ambulatory Visit: Payer: Self-pay

## 2020-10-13 VITALS — BP 106/64 | HR 56 | Ht 65.0 in | Wt 126.0 lb

## 2020-10-13 DIAGNOSIS — M25552 Pain in left hip: Secondary | ICD-10-CM

## 2020-10-13 DIAGNOSIS — M545 Low back pain, unspecified: Secondary | ICD-10-CM | POA: Diagnosis not present

## 2020-10-13 NOTE — Patient Instructions (Signed)
[facility] [phone] Call Today  When we receive your results we will contact you.  Can't wait to meet worse half

## 2020-10-13 NOTE — Assessment & Plan Note (Signed)
Concerned that patient is having more radicular symptoms from patient's back.  Seems to be more in the L3-L4 and distribution.  Seems to be associated with more back pain does have more of the leg pain.  Patient has done everything possible to help with the hip pain and has failed conservative therapy including formal physical therapy, home exercises, as well as medications.  At this point I do feel advanced imaging is warranted and we will get an MRI to further evaluate.  Depending on findings patient could be a candidate for possible injections such as epidural or facet injections.  Patient will follow-up after imaging and will discuss further.

## 2020-10-22 ENCOUNTER — Ambulatory Visit
Admission: RE | Admit: 2020-10-22 | Discharge: 2020-10-22 | Disposition: A | Discharge status: Home / Self Care | Payer: Managed Care, Other (non HMO) | Source: Ambulatory Visit | Attending: Family Medicine | Admitting: Family Medicine

## 2020-10-22 ENCOUNTER — Encounter: Payer: Self-pay | Admitting: Family Medicine

## 2020-10-22 DIAGNOSIS — M545 Low back pain, unspecified: Secondary | ICD-10-CM

## 2020-10-22 DIAGNOSIS — M25552 Pain in left hip: Secondary | ICD-10-CM

## 2020-10-23 LAB — HM MAMMOGRAPHY

## 2020-11-05 ENCOUNTER — Ambulatory Visit
Admission: RE | Admit: 2020-11-05 | Discharge: 2020-11-05 | Disposition: A | Discharge status: Home / Self Care | Payer: Managed Care, Other (non HMO) | Source: Ambulatory Visit | Attending: Family Medicine | Admitting: Family Medicine

## 2020-11-05 ENCOUNTER — Other Ambulatory Visit: Payer: Self-pay

## 2020-11-05 DIAGNOSIS — M545 Low back pain, unspecified: Secondary | ICD-10-CM

## 2020-11-05 MED ORDER — METHYLPREDNISOLONE ACETATE 40 MG/ML INJ SUSP (RADIOLOG
80.0000 mg | Freq: Once | INTRAMUSCULAR | Status: AC
  Administered 2020-11-05: 80 mg via EPIDURAL

## 2020-11-05 MED ORDER — IOPAMIDOL (ISOVUE-M 200) INJECTION 41%
1.0000 mL | Freq: Once | INTRAMUSCULAR | Status: AC
  Administered 2020-11-05: 1 mL via EPIDURAL

## 2020-11-05 NOTE — Discharge Instructions (Signed)

## 2020-11-17 ENCOUNTER — Telehealth (INDEPENDENT_AMBULATORY_CARE_PROVIDER_SITE_OTHER): Payer: Managed Care, Other (non HMO) | Admitting: Physician Assistant

## 2020-11-17 ENCOUNTER — Encounter: Payer: Self-pay | Admitting: Physician Assistant

## 2020-11-17 ENCOUNTER — Other Ambulatory Visit: Payer: Self-pay

## 2020-11-17 VITALS — Ht 65.0 in | Wt 125.0 lb

## 2020-11-17 DIAGNOSIS — R051 Acute cough: Secondary | ICD-10-CM

## 2020-11-17 DIAGNOSIS — J111 Influenza due to unidentified influenza virus with other respiratory manifestations: Secondary | ICD-10-CM | POA: Diagnosis not present

## 2020-11-17 MED ORDER — HYDROCOD POLST-CPM POLST ER 10-8 MG/5ML PO SUER: 5.0000 mL | Freq: Two times a day (BID) | ORAL | 0 refills | Status: DC | PRN

## 2020-11-17 MED ORDER — ALBUTEROL SULFATE HFA 108 (90 BASE) MCG/ACT IN AERS: 2.0000 | INHALATION_SPRAY | Freq: Four times a day (QID) | RESPIRATORY_TRACT | 0 refills | Status: DC | PRN

## 2020-11-17 NOTE — Progress Notes (Signed)
Virtual Visit via Video   I connected with Kristine [name] on 11/17/20 at 11:30 AM EDT by a video enabled telemedicine application and verified that I am speaking with the correct person using two identifiers. Location patient: Home Location provider:  [facility], Office Persons participating in the virtual visit: Kristine [name], Kristine Motto PA-C, Kristine Mull, LPN   I discussed the limitations of evaluation and management by telemedicine and the availability of in person appointments. The patient expressed understanding and agreed to proceed.  I acted as a Neurosurgeon for [employer], PA-C [employer], LPN   Subjective:   HPI:   Cough She was on vacation last week with her husband who, while on vacation, tested positive for influenza A. She started with sore throat and fatigue on Thursday. Now she has a productive cough, chest congestion, headache, lightheaded. Did 2 home COVID tests that were both Negative.  She is taking Delsym, Robutussin DM.  Denies: Fever, shortness of breath, inability to eat or drink fluids, personal history of asthma, COPD, tobacco abuse    ROS: See pertinent positives and negatives per HPI.  Patient Active Problem List   Diagnosis Date Noted   Ganglion cyst of finger of left hand 06/16/2020   Low back pain 05/05/2020   Left hip pain 10/29/2019   Arthritis of carpometacarpal [employer] [facility]) joint of right thumb 04/30/2019   Trigger finger, right ring finger 11/26/2018   Glaucoma 02/08/2016   Eczema 08/26/2014   Allergic rhinitis 08/26/2014   History of colonic polyps 04/23/2010   Graves disease 01/24/2007    Social History   Tobacco Use   Smoking status: Never   Smokeless tobacco: Never  Substance Use Topics   Alcohol use: No    Alcohol/week: 0.0 standard drinks    Current Outpatient Medications:    albuterol (VENTOLIN HFA) 108 (90 Base) MCG/ACT inhaler, Inhale 2 puffs into the lungs every 6 (six) hours as needed for wheezing or  shortness of breath., Disp: 8 g, Rfl: 0   chlorpheniramine-HYDROcodone (TUSSIONEX PENNKINETIC ER) 10-8 MG/5ML SUER, Take 5 mLs by mouth every 12 (twelve) hours as needed for cough., Disp: 115 mL, Rfl: 0   Cholecalciferol (VITAMIN D3 PO), Take by mouth., Disp: , Rfl:    diphenhydrAMINE (BENADRYL) 25 MG tablet, Take 25 mg by mouth daily., Disp: , Rfl:    dorzolamide-timolol (COSOPT) 22.3-6.8 MG/ML ophthalmic solution, Place 1 drop into both eyes daily. Through optho, Disp: 1 mL, Rfl: 0   loratadine (CLARITIN) 10 MG tablet, Take 10 mg by mouth daily., Disp: , Rfl:    Lysine 1000 MG TABS, , Disp: , Rfl:    methimazole (TAPAZOLE) 10 MG tablet, TAKE 1/2 TABLET BY MOUTH DAILY, Disp: 45 tablet, Rfl: 3   Misc Natural Products (TURMERIC CURCUMIN) CAPS, Take 1 capsule by mouth daily., Disp: , Rfl:    Multiple Vitamin (MULTIVITAMIN) tablet, Take 1 tablet by mouth daily., Disp: , Rfl:    Omega-3 Fatty Acids (FISH OIL) 500 MG CAPS, Take by mouth. 1200 mg daily, Disp: , Rfl:    Tart Cherry 1200 MG CAPS, , Disp: , Rfl:   No Known Allergies  Objective:   VITALS: Per patient if applicable, see vitals. GENERAL: Alert, appears well and in no acute distress. HEENT: Atraumatic, conjunctiva clear, no obvious abnormalities on inspection of external nose and ears. NECK: Normal movements of the head and neck. CARDIOPULMONARY: No increased WOB. Speaking in clear sentences. I:E ratio WNL.  MS: Moves all visible extremities without  noticeable abnormality. PSYCH: Pleasant and cooperative, well-groomed. Speech normal rate and rhythm. Affect is appropriate. Insight and judgement are appropriate. Attention is focused, linear, and appropriate.  NEURO: CN grossly intact. Oriented as arrived to appointment on time with no prompting. Moves both UE equally.  SKIN: No obvious lesions, wounds, erythema, or cyanosis noted on face or hands.  Assessment and Plan:   Kristine [name] was seen today for cough.  Diagnoses and all orders for  this visit:  Acute cough; Influenza No red flags on discussion.  We are unfortunately outside of the Tamiflu window.  Will initiate albuterol inhaler as needed and Tussionex cough syrup per orders. Discussed taking medications as prescribed. Reviewed return precautions including new fever, SOB, worsening cough or other concerns. Push fluids and rest. I recommend that patient follow-up if symptoms worsen or persist despite treatment x 7-10 days, sooner if needed.  Other orders -     albuterol (VENTOLIN HFA) 108 (90 Base) MCG/ACT inhaler; Inhale 2 puffs into the lungs every 6 (six) hours as needed for wheezing or shortness of breath. -     chlorpheniramine-HYDROcodone (TUSSIONEX PENNKINETIC ER) 10-8 MG/5ML SUER; Take 5 mLs by mouth every 12 (twelve) hours as needed for cough.   I discussed the assessment and treatment plan with the patient. The patient was provided an opportunity to ask questions and all were answered. The patient agreed with the plan and demonstrated an understanding of the instructions.   The patient was advised to call back or seek an in-person evaluation if the symptoms worsen or if the condition fails to improve as anticipated.   CMA or LPN served as scribe during this visit. History, Physical, and Plan performed by medical provider. The above documentation has been reviewed and is accurate and complete.   [place], [place] 11/17/2020

## 2020-11-19 ENCOUNTER — Telehealth: Payer: Self-pay

## 2020-11-19 NOTE — Telephone Encounter (Signed)
Error

## 2020-11-20 ENCOUNTER — Ambulatory Visit (INDEPENDENT_AMBULATORY_CARE_PROVIDER_SITE_OTHER): Payer: Managed Care, Other (non HMO) | Admitting: Physician Assistant

## 2020-11-20 ENCOUNTER — Encounter: Payer: Self-pay | Admitting: Physician Assistant

## 2020-11-20 ENCOUNTER — Other Ambulatory Visit: Payer: Self-pay

## 2020-11-20 VITALS — BP 110/70 | HR 55 | Temp 97.9°F | Wt 124.8 lb

## 2020-11-20 DIAGNOSIS — R052 Subacute cough: Secondary | ICD-10-CM | POA: Diagnosis not present

## 2020-11-20 MED ORDER — AZITHROMYCIN 250 MG PO TABS: ORAL_TABLET | ORAL | 0 refills | Status: AC

## 2020-11-20 NOTE — Progress Notes (Signed)
Kristine [name] is a 65 y.o. female here for a follow up of a pre-existing problem.    History of Present Illness:   Chief Complaint  Patient presents with   Cough    Follow up, coughing up mucus still    HPI  Cough/Influenza She was seen by me on 11/17/20 for a virtual visit after contracting the flu from her husband while on vacation. Today is day 8 of symptoms. Symptoms started with sore throat and fatigue, progressed to a productive cough, chest congestion, headache, lightheaded. Did 2 home COVID tests that were both Negative. I prescribed her an albuterol inhaler for prn use and tussionex cough syrup.   She is taking Delsym, Robutussin DM.   Denies: Fever, shortness of breath, inability to eat or drink fluids, personal history of asthma, COPD, tobacco abuse  Overall since her visit with me she has been having ongoing cough that has not improved.  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 LAD, former smoker distant   Lung disease Mother        "shock lung"   Colon cancer Neg Hx    Colon polyps Neg Hx    Rectal cancer Neg Hx    Stomach cancer Neg Hx    Esophageal cancer Neg Hx     No Known Allergies  Current Medications:   Current Outpatient Medications:    albuterol (VENTOLIN HFA) 108 (90 Base) MCG/ACT inhaler, Inhale 2 puffs into the lungs every 6 (six) hours as needed for wheezing or shortness of breath., Disp: 8 g,  Rfl: 0   azithromycin (ZITHROMAX) 250 MG tablet, Take 2 tablets on day 1, then 1 tablet daily on days 2 through 5, Disp: 6 tablet, Rfl: 0   chlorpheniramine-HYDROcodone (TUSSIONEX PENNKINETIC ER) 10-8 MG/5ML SUER, Take 5 mLs by mouth every 12 (twelve) hours as needed for cough., Disp: 115 mL, Rfl: 0   Cholecalciferol (VITAMIN D3 PO), Take by mouth., Disp: , Rfl:    diphenhydrAMINE (BENADRYL) 25 MG tablet, Take 25 mg by mouth daily., Disp: , Rfl:    dorzolamide-timolol (COSOPT) 22.3-6.8 MG/ML ophthalmic solution, Place 1 drop into both eyes daily. Through optho, Disp: 1 mL, Rfl: 0   loratadine (CLARITIN) 10 MG tablet, Take 10 mg by mouth daily., Disp: , Rfl:    Lysine 1000 MG TABS, , Disp: , Rfl:    methimazole (TAPAZOLE) 10 MG tablet, TAKE 1/2 TABLET BY MOUTH DAILY, Disp: 45 tablet, Rfl: 3   Misc Natural Products (TURMERIC CURCUMIN) CAPS, Take 1 capsule by mouth daily., Disp: , Rfl:    Multiple Vitamin (MULTIVITAMIN) tablet, Take 1 tablet by mouth daily., Disp: , Rfl:    Omega-3 Fatty Acids (FISH OIL) 500 MG CAPS, Take by mouth. 1200 mg daily,  Disp: , Rfl:    Tart Cherry 1200 MG CAPS, , Disp: , Rfl:    Review of Systems:   ROS Negative unless otherwise specified per HPI.  Vitals:   Vitals:   11/20/20 1310  BP: 110/70  Pulse: (!) 55  Temp: 97.9 F (36.6 C)  TempSrc: Temporal  SpO2: 97%  Weight: 124 lb 12.8 oz (56.6 kg)     Body mass index is 20.77 kg/m.  Physical Exam:   Physical Exam Vitals and nursing note reviewed.  Constitutional:      General: She is not in acute distress.    Appearance: She is well-developed. She is not ill-appearing or toxic-appearing.  HENT:     Head: Normocephalic and atraumatic.     Right Ear: Ear canal and external ear normal. A middle ear effusion is present. Tympanic membrane is not erythematous, retracted or bulging.     Left Ear: Tympanic membrane, ear canal and external ear normal. Tympanic membrane is not erythematous, retracted or bulging.      Nose: Nose normal.     Right Sinus: No maxillary sinus tenderness or frontal sinus tenderness.     Left Sinus: No maxillary sinus tenderness or frontal sinus tenderness.     Mouth/Throat:     Pharynx: Uvula midline. No posterior oropharyngeal erythema.  Eyes:     General: Lids are normal.     Conjunctiva/sclera: Conjunctivae normal.  Neck:     Trachea: Trachea normal.  Cardiovascular:     Rate and Rhythm: Normal rate and regular rhythm.     Heart sounds: Normal heart sounds, S1 normal and S2 normal.  Pulmonary:     Effort: Pulmonary effort is normal.     Breath sounds: Normal breath sounds. No decreased breath sounds, wheezing, rhonchi or rales.  Lymphadenopathy:     Cervical: No cervical adenopathy.  Skin:    General: Skin is warm and dry.  Neurological:     Mental Status: She is alert.  Psychiatric:        Speech: Speech normal.        Behavior: Behavior normal. Behavior is cooperative.    Assessment and Plan:   Subacute cough No red flags on exam.  Concern for atypical/bacterial bronchitis due to recent travel status post flu.  She would like to avoid prednisone at all cost.  Will initiate azithromycin per orders. Discussed taking medications as prescribed. Reviewed return precautions including development of fever, SOB, worsening cough or other concerns. Push fluids and rest. I recommend that patient follow-up if symptoms worsen or persist despite treatment x 7-10 days, sooner if needed.  Jarold Motto, PA-C

## 2020-11-30 NOTE — Progress Notes (Signed)
Kristine [name] is a 65 y.o. female here for a ongoing productive cough.    History of Present Illness:   Chief Complaint  Patient presents with   Cough    Still having cough, dry non-productive.   c/o rib pain    Pt having pain left rib area, worse over the weekend. Pt she thinks she pulled a muscle from coughing.    HPI  Chronic Cough Previously Kristine [name] was seen by me on 11/17/20 for a virtual visit after contracting the flu from her husband while on vacation. She was experiencing  sore throat and fatigue, progressed to a productive cough, chest congestion, headache, lightheaded. Did 2 home COVID tests that were both negative. At that time I prescribed her an albuterol inhaler for prn use and tussionex cough syrup.  Today Kristine [name] presents with c/o continued dry non-productive cough. Ms. Chapdelaine reports feeling tightness while coughing as well as the cough worsening as the day goes on. Occasionally feels "loopy" while on albuterol inhaler 108 mcg but compliant with taking it since it provide temporary relief. Denies depth of cough worsening.    Posterior Left Side Pain [name] also c/o left posterior pain in her rib area that appear 4 days ago and worsened over the weekend. She believes she might have pulled a muscle due to her constant coughing. Denies urinary issue and hx of kidney stones.   She has admitted to having a hx of costochondritis, but states current muscle pain is not similar.  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 LAD, former smoker distant   Lung disease Mother        "shock lung"   Colon cancer Neg Hx    Colon polyps Neg Hx    Rectal cancer Neg Hx    Stomach cancer Neg Hx    Esophageal cancer Neg Hx     No Known Allergies  Current Medications:   Current Outpatient Medications:    albuterol (VENTOLIN HFA) 108 (90 Base) MCG/ACT inhaler, Inhale 2 puffs into the lungs every 6 (six) hours as needed for wheezing or shortness of breath., Disp: 8 g, Rfl: 0   chlorpheniramine-HYDROcodone (TUSSIONEX PENNKINETIC ER) 10-8 MG/5ML SUER, Take 5 mLs by mouth every 12 (twelve) hours as needed for cough., Disp: 115 mL, Rfl: 0   Cholecalciferol (VITAMIN D3 PO), Take by mouth., Disp: , Rfl:    diphenhydrAMINE (BENADRYL) 25 MG tablet, Take 25 mg by mouth daily., Disp: , Rfl:    dorzolamide-timolol (COSOPT) 22.3-6.8 MG/ML ophthalmic solution, Place 1 drop into both eyes daily. Through optho, Disp: 1 mL, Rfl: 0   loratadine (CLARITIN) 10 MG tablet, Take 10 mg by mouth daily., Disp: ,  Rfl:    Lysine 1000 MG TABS, , Disp: , Rfl:    methimazole (TAPAZOLE) 10 MG tablet, TAKE 1/2 TABLET BY MOUTH DAILY, Disp: 45 tablet, Rfl: 3   Misc Natural Products (TURMERIC CURCUMIN) CAPS, Take 1 capsule by mouth daily., Disp: , Rfl:    Multiple Vitamin (MULTIVITAMIN) tablet, Take 1 tablet by mouth daily., Disp: , Rfl:    Omega-3 Fatty Acids (FISH OIL) 500 MG CAPS, Take by mouth. 1200 mg daily, Disp: , Rfl:    Tart Cherry 1200 MG CAPS, , Disp: , Rfl:    Review of Systems:   ROS Negative unless otherwise specified per HPI.  Vitals:   Vitals:   12/01/20 0947  BP: 110/70  Pulse: (!) 56  Temp: 97.9 F (36.6 C)  TempSrc: Temporal  SpO2: 97%  Weight: 124 lb 8 oz (56.5 kg)  Height: 5\' 5"  (1.651 m)     Body mass index is 20.72 kg/m.  Physical Exam:    Physical Exam Vitals and nursing note reviewed.  Constitutional:      General: She is not in acute distress.    Appearance: She is well-developed. She is not ill-appearing or toxic-appearing.  Cardiovascular:     Rate and Rhythm: Normal rate and regular rhythm.     Pulses: Normal pulses.     Heart sounds: Normal heart sounds, S1 normal and S2 normal.  Pulmonary:     Effort: Pulmonary effort is normal.     Breath sounds: Normal breath sounds.  Abdominal:     Tenderness: There is no right CVA tenderness or left CVA tenderness.  Musculoskeletal:     Comments: TTP to posterior left lower ribcage - no visible abnormalities  Skin:    General: Skin is warm and dry.  Neurological:     Mental Status: She is alert.     GCS: GCS eye subscore is 4. GCS verbal subscore is 5. GCS motor subscore is 6.  Psychiatric:        Speech: Speech normal.        Behavior: Behavior normal. Behavior is cooperative.    Assessment and Plan:   Chronic Dry Non- Productive Cough Suspect bronchitis No red flags Trial ICS in AM and PM; I have sent in flovent Update CXR to r/o other cause and due to chronicity of sx Follow-up if any new/worsening symptoms  Rib pain Suspect inflammation of rib/muscle due to coughing Trial topical voltaren If no improvement, will consider muscle relaxer Follow-up if new/worsening   I,Havlyn C Ratchford,acting as a scribe for , PA.,have documented all relevant documentation on the behalf of [employer], PA,as directed by  Jarold Motto, PA while in the presence of Jarold Motto, Jarold Motto.  I, [place], Jarold Motto, have reviewed all documentation for this visit. The documentation on 12/01/20 for the exam, diagnosis, procedures, and orders are all accurate and complete.   12/03/20, PA-C

## 2020-12-01 ENCOUNTER — Encounter: Payer: Self-pay | Admitting: Physician Assistant

## 2020-12-01 ENCOUNTER — Other Ambulatory Visit: Payer: Self-pay

## 2020-12-01 ENCOUNTER — Ambulatory Visit (INDEPENDENT_AMBULATORY_CARE_PROVIDER_SITE_OTHER): Payer: Managed Care, Other (non HMO) | Admitting: Physician Assistant

## 2020-12-01 ENCOUNTER — Ambulatory Visit (INDEPENDENT_AMBULATORY_CARE_PROVIDER_SITE_OTHER)
Admission: RE | Admit: 2020-12-01 | Discharge: 2020-12-01 | Disposition: A | Discharge status: Home / Self Care | Payer: Managed Care, Other (non HMO) | Source: Ambulatory Visit | Attending: Physician Assistant | Admitting: Physician Assistant

## 2020-12-01 VITALS — BP 110/70 | HR 56 | Temp 97.9°F | Ht 65.0 in | Wt 124.5 lb

## 2020-12-01 DIAGNOSIS — R0781 Pleurodynia: Secondary | ICD-10-CM

## 2020-12-01 DIAGNOSIS — R053 Chronic cough: Secondary | ICD-10-CM

## 2020-12-01 MED ORDER — FLUTICASONE-SALMETEROL 100-50 MCG/ACT IN AEPB: 1.0000 | INHALATION_SPRAY | Freq: Two times a day (BID) | RESPIRATORY_TRACT | 1 refills | Status: DC

## 2020-12-01 NOTE — Patient Instructions (Addendum)
It was great to see you!  An order for an xray has been put in for you. To get your xray, you can walk in at the [facility] location without a scheduled appointment.  The address is [address] It is across the street from [facility]. X-ray is located in the basement.  Hours of operation are M-F 8:30am to 5:00pm. Please note that they are closed for lunch between 12:30 and 1:00pm.  Trial topical voltaren gel -- 2-4 times daily If no improvement, let me know and we will do a muscle relaxer  Start inhaler twice daily -- if this is too expensive, ask the pharmacist if another steroid-inhaler will be covered better with your insurance and I will send this in  Take care,  Jarold Motto PA-C

## 2020-12-22 ENCOUNTER — Encounter: Payer: Self-pay | Admitting: Family Medicine

## 2020-12-25 NOTE — Progress Notes (Signed)
[name] [address] [place] [address] Phone: [phone] Subjective:   [name], am serving as a scribe for Dr. Antoine Primas. This visit occurred during the SARS-CoV-2 public health emergency.  Safety protocols were in place, including screening questions prior to the visit, additional usage of staff PPE, and extensive cleaning of exam room while observing appropriate contact time as indicated for disinfecting solutions.   I'm seeing this patient by the request  of:  [name], MD  CC: back and finger pain   [id number]  10/13/2020 Concerned that patient is having more radicular symptoms from patient's back.  Seems to be more in the L3-L4 and distribution.  Seems to be associated with more back pain does have more of the leg pain.  Patient has done everything possible to help with the hip pain and has failed conservative therapy including formal physical therapy, home exercises, as well as medications.  At this point I do feel advanced imaging is warranted and we will get an MRI to further evaluate.  Depending on findings patient could be a candidate for possible injections such as epidural or facet injections.  Patient will follow-up after imaging and will discuss further.  Updated 12/28/2020 GERALYN FIGIEL is a 65 y.o. female coming in with complaint of trigger finger, hip, and back pain. Epi on 11/05/2020 at the L4-L5 level. 100 mile bike ride in 10 days. Injection today. Epidural somewhat helped. No new complaints.  MRI IMPRESSION: Mild lumbar degenerative change most prominent at L4-5. Grade 1 anterolisthesis at L4-5. Shallow left foraminal disc protrusion with displacement of the left L4 nerve root in the foramen.  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 54 (grandson 4028) and 22 year old daughters (grandson in late 2020) in 2016. Both live close.    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       Hobbies: read, walk dog, yoga, movies, steelers in the fall   Social Determinants of Health   Financial Resource Strain: Not on file  Food Insecurity: Not on file  Transportation Needs: Not on file  Physical Activity: Not on file  Stress: Not on file  Social Connections: Not on file   No Known Allergies Family History  Problem Relation  Age of Onset   Heart disease Father        40 MI- 4 stents incl. LAD, former smoker distant   Lung disease Mother        "shock lung"   Colon cancer Neg Hx    Colon polyps Neg Hx    Rectal cancer Neg Hx    Stomach cancer Neg Hx    Esophageal cancer Neg Hx     Current Outpatient Medications (Endocrine & Metabolic):    methimazole (TAPAZOLE) 10 MG tablet, TAKE 1/2 TABLET BY MOUTH DAILY   Current Outpatient Medications (Respiratory):    albuterol (VENTOLIN HFA) 108 (90 Base) MCG/ACT inhaler, Inhale 2 puffs into the lungs every 6 (six) hours as needed for wheezing or shortness of breath.    chlorpheniramine-HYDROcodone (TUSSIONEX PENNKINETIC ER) 10-8 MG/5ML SUER, Take 5 mLs by mouth every 12 (twelve) hours as needed for cough.   diphenhydrAMINE (BENADRYL) 25 MG tablet, Take 25 mg by mouth daily.   fluticasone-salmeterol (ADVAIR DISKUS) 100-50 MCG/ACT AEPB, Inhale 1 puff into the lungs 2 (two) times daily.   loratadine (CLARITIN) 10 MG tablet, Take 10 mg by mouth daily.    Current Outpatient Medications (Other):    Cholecalciferol (VITAMIN D3 PO), Take by mouth.   dorzolamide-timolol (COSOPT) 22.3-6.8 MG/ML ophthalmic solution, Place 1 drop into both eyes daily. Through optho   Lysine 1000 MG TABS,    Misc Natural Products (TURMERIC CURCUMIN) CAPS, Take 1 capsule by mouth daily.   Multiple Vitamin (MULTIVITAMIN) tablet, Take 1 tablet by mouth daily.   Omega-3 Fatty Acids (FISH OIL) 500 MG CAPS, Take by mouth. 1200 mg daily   Tart Cherry 1200 MG CAPS,    Reviewed prior external information including notes and imaging from  primary care provider As well as notes that were available from care everywhere and other healthcare systems.  Past medical history, social, surgical and family history all reviewed in electronic medical record.  No pertanent information unless stated regarding to the chief complaint.   Review of Systems:  No headache, visual changes, nausea, vomiting, diarrhea, constipation, dizziness, abdominal pain, skin rash, fevers, chills, night sweats, weight loss, swollen lymph nodes, body aches, joint swelling, chest pain, shortness of breath, mood changes. POSITIVE muscle aches  Objective  Blood pressure 126/70, pulse (!) 56, height 5\' 5"  (1.651 m), weight 123 lb (55.8 kg), last menstrual period 03/31/2010, SpO2 99 %.   General: No apparent distress alert and oriented x3 mood and affect normal, dressed appropriately.  HEENT: Pupils equal, extraocular movements intact  Respiratory: Patient's speak in full sentences and does not appear short of breath   Cardiovascular: No lower extremity edema, non tender, no erythema  Gait antalgic  MSK: Back exam does show some very mild loss of lordosis.  Nontender over the right side of the neck. Right hand exam shows the patient does have tenderness to palpation over the A2 pulley   Procedure: Real-time Ultrasound Guided Injection of right flexor tendon sheath of the ring finger Device: GE Logiq Q7 Ultrasound guided injection is preferred based studies that show increased duration, increased effect, greater accuracy, decreased procedural pain, increased response rate, and decreased cost with ultrasound guided versus blind injection.  Verbal informed consent obtained.  Time-out conducted.  Noted no overlying erythema, induration, or other signs of local infection.  Skin prepped in a sterile fashion.  Local anesthesia: Topical Ethyl chloride.  With sterile technique and under real time ultrasound guidance: With a 25-gauge half inch needle injected with  0.5 cc of 0.5% Marcaine and 0.5 cc of Kenalog 40 mg/mL Completed without difficulty  Pain immediately resolved suggesting accurate placement of the medication.  Advised to call if fevers/chills, erythema, induration, drainage, or persistent bleeding.  Impression: Technically successful ultrasound guided injection.   Impression and Recommendations:    The above documentation has been reviewed and is accurate and complete Judi Saa, DO

## 2020-12-28 ENCOUNTER — Ambulatory Visit: Payer: Self-pay

## 2020-12-28 ENCOUNTER — Encounter: Payer: Self-pay | Admitting: Family Medicine

## 2020-12-28 ENCOUNTER — Other Ambulatory Visit: Payer: Self-pay

## 2020-12-28 ENCOUNTER — Ambulatory Visit (INDEPENDENT_AMBULATORY_CARE_PROVIDER_SITE_OTHER): Payer: Managed Care, Other (non HMO) | Admitting: Family Medicine

## 2020-12-28 VITALS — BP 126/70 | HR 56 | Ht 65.0 in | Wt 123.0 lb

## 2020-12-28 DIAGNOSIS — M65341 Trigger finger, right ring finger: Secondary | ICD-10-CM | POA: Diagnosis not present

## 2020-12-28 NOTE — Assessment & Plan Note (Signed)
Repeat injection given today, tolerated the procedure well.  Discussed icing regimen and home exercises.  Increase activity slowly.  Follow-up with me again in 6 to 8 weeks.

## 2020-12-28 NOTE — Patient Instructions (Signed)
Injection today Good to see you! Enjoy your bike You'll be great Happy Holidays See me when you need me

## 2021-03-05 NOTE — Progress Notes (Signed)
Phone [phone]   Subjective:  Patient presents today for their annual physical. Chief complaint-noted.   See problem oriented charting- ROS- full  review of systems was completed and negative per full ROS sheet- does mention CMC arthritis (better with voltaren) and trigger finger (but improved)  The following were reviewed and entered/updated in epic: Past Medical History:  Diagnosis Date   Allergy    GRAVE'S DISEASE 01/24/2007   Patient Active Problem List   Diagnosis Date Noted   Graves disease 01/24/2007    Priority: Medium    Eczema 08/26/2014    Priority: Low   Allergic rhinitis 08/26/2014    Priority: Low   History of colonic polyps 04/23/2010    Priority: Low   Ganglion cyst of finger of left hand 06/16/2020   Low back pain 05/05/2020   Left hip pain 10/29/2019   Arthritis of carpometacarpal (CMC) joint of right thumb 04/30/2019   Trigger finger, right ring finger 11/26/2018   Glaucoma 02/08/2016   Past Surgical History:  Procedure Laterality Date   CARPAL TUNNEL RELEASE     3-4 yeras ago from 2016   COLONOSCOPY  2010, 2016   polyp in 2010, none 2016   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 LAD, former smoker distant   Lung disease Mother        "shock lung"   Colon cancer Neg Hx    Colon polyps Neg Hx    Rectal cancer Neg Hx    Stomach cancer Neg Hx    Esophageal cancer Neg Hx     Medications- reviewed and updated Current Outpatient Medications  Medication Sig Dispense Refill   Cholecalciferol (VITAMIN D3 PO) Take by mouth.     diphenhydrAMINE (BENADRYL) 25 MG tablet Take 25 mg by mouth daily.     dorzolamide-timolol (COSOPT) 22.3-6.8 MG/ML ophthalmic solution Place 1 drop into both eyes daily. Through optho 1 mL 0   loratadine (CLARITIN) 10 MG tablet Take 10 mg by mouth daily.     methimazole (TAPAZOLE) 10 MG tablet  TAKE 1/2 TABLET BY MOUTH DAILY 45 tablet 3   Misc Natural Products (TURMERIC CURCUMIN) CAPS Take 1 capsule by mouth daily.     Multiple Vitamin (MULTIVITAMIN) tablet Take 1 tablet by mouth daily.     Omega-3 Fatty Acids (FISH OIL) 500 MG CAPS Take by mouth. 1200 mg daily     Tart Cherry 1200 MG CAPS      No current facility-administered medications for this visit.    Allergies-reviewed and updated No Known Allergies  Social History   Social History Narrative   Married. 62 (grandson 622) and 38 year old daughters (grandson in late 2020) in 2016. Both live close.    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  Oropharynx normal Neck: mild thyromegaly CV: RRR no murmurs rubs or gallops Lungs: CTAB no crackles, wheeze, rhonchi Abdomen: soft/nontender/nondistended/normal bowel sounds. No rebound or guarding.  Ext: no edema Skin: warm, dry Neuro: grossly normal, moves all extremities, PERRLA   Assessment and Plan   66 y.o. female presenting for annual physical.  Health Maintenance counseling: 1. Anticipatory guidance: Patient counseled regarding regular dental exams -q6 months, eye exams-every 9 months with glaucoma history,   avoiding smoking and second hand smoke , limiting alcohol to 1 beverage per day-does not drink. .  No illicit drugs  2. Risk factor reduction:  Advised patient of need for regular exercise and diet rich and fruits and vegetables to reduce risk of heart attack and stroke.  Exercise- doing some yoga twice a week, spin bike indoors twice a week, running once a week- hip doing better. Also regular  walking.  Diet/Management: reasonably healthy diet.  Wt Readings from Last 3 Encounters:  03/08/21 123 lb 12.8 oz (56.2 kg)  12/28/20 123 lb (55.8 kg)  12/01/20 124 lb 8 oz (56.5 kg)  3. Immunizations/screenings/ancillary studies- up to date. Thankfully recovered from chronic cough in October- messed up a big trip they had planned. Possible flu- husband was positive Immunization History  Administered Date(s) Administered   Influenza Whole 02/12/2009, 10/13/2009   Influenza,inj,Quad PF,6+ Mos 01/30/2013, 12/26/2013, 10/29/2018   Influenza-Unspecified 03/26/2011, 12/06/2015, 02/04/2017, 11/04/2017, 11/15/2019, 10/23/2020   PFIZER Comirnaty(Gray Top)Covid-19 Tri-Sucrose Vaccine 07/18/2020   PFIZER(Purple Top)SARS-COV-2 Vaccination 04/29/2019, 05/27/2019, 11/30/2019   Pfizer Covid-19 Vaccine Bivalent Booster 23yrs & up 10/23/2020   Td 09/09/2009   Tdap 02/09/2016   Zoster Recombinat (Shingrix) 02/26/2018, 05/01/2018  4. Cervical cancer screening- pap smear 1//7/22 with 3 year repeat planned 5. Breast cancer screening-  breast exam with GYN  and mammogram  10/23/20 with 1 year repeat panned 6. Colon cancer screening - colonoscopy 02/09/16 with 10 year repeat planned  7. Skin cancer screening-has seen Dr. Sharyn Lull in the past-not recently- plans to schedule. advised regular sunscreen use. Denies worrisome, changing, or new skin lesions.  8. Birth control/STD check- postmenopausal/monogamous 9. Osteoporosis screening at 65- DEXA  02/23/16 with 10 year repeat planned possible -Never smoker-   Status of chronic or acute concerns   #Graves' disease-update labs on 02/2020 visit.  Patient wanted to continue chronic methimazole 5 mg daily.  Does not want more intervention than this.  Refill 5 mg dose as needed. Asymptomatic unless misses a dose- other than remaining very cold natured for last 5 years. -discussed if febrile we need to know asap due to risk of agranulocytosis   #hyperlipidemia S:  Medication: None Lab Results  Component Value Date   CHOL 189 03/02/2020   HDL 63.00 03/02/2020   LDLCALC 114 (H) 03/02/2020   TRIG 62.0 03/02/2020   CHOLHDL 3 03/02/2020   A/P: very low ascvd risk- continue excellent healthy lifestyle choices. Recalculate with new lipids today  #Eczema-no recent issues. Not sure why but doing better.    #Seasonal allergies-worse in the spring-okay at the moment.  Uses Claritin regularly.  Had allergy eyedrops as well from eye doctor  -avoids bees- does not have epi pen   #Glaucoma-reasonably controlled-maintained on eye drops cosopt  #MSK issues- trigger finger did well most recently. Voltaren working well for [facility] arthrtis -IT band better with wall sits  - hip better overall- has learned to manage    Recommended follow up: Return in about 1 year (around 03/08/2022) for physical or sooner if needed.  Lab/Order associations:NOT fasting   ICD-10-CM   1. Preventative health care  Z00.00 CBC with Differential/Platelet    Comprehensive metabolic panel    Lipid panel    TSH    T4, free    T3, free    T3, free    T4, free    TSH    Lipid panel    Comprehensive metabolic panel    CBC with Differential/Platelet    2. Hyperlipidemia, unspecified hyperlipidemia type  E78.5 CBC with Differential/Platelet    Comprehensive metabolic panel    Lipid panel    Lipid panel    Comprehensive metabolic panel    CBC with Differential/Platelet    3. Graves disease  E05.00 TSH    T4, free    T3, free    T3, free    T4, free    TSH      No orders of the defined types were placed in this encounter.  I,Jada Bradford,acting as a scribe for Tana ConchStephen Keyly Baldonado, MD.,have documented all relevant documentation on the behalf of Tana ConchStephen Leverne Amrhein, MD,as directed by  Tana ConchStephen Kristiane Morsch, MD while in the presence of Tana ConchStephen Roxanna Mcever, MD.  I, Tana ConchStephen Agnes Brightbill, MD, have reviewed all documentation for this visit. The documentation on 03/08/21 for the exam, diagnosis, procedures, and  orders are all accurate and complete.  Return precautions advised.  Tana ConchStephen Avarie Tavano, MD

## 2021-03-08 ENCOUNTER — Other Ambulatory Visit: Payer: Self-pay

## 2021-03-08 ENCOUNTER — Encounter: Payer: Self-pay | Admitting: Family Medicine

## 2021-03-08 ENCOUNTER — Ambulatory Visit (INDEPENDENT_AMBULATORY_CARE_PROVIDER_SITE_OTHER): Payer: Managed Care, Other (non HMO) | Admitting: Family Medicine

## 2021-03-08 VITALS — BP 110/62 | HR 55 | Temp 97.9°F | Ht 65.0 in | Wt 123.8 lb

## 2021-03-08 DIAGNOSIS — Z0000 Encounter for general adult medical examination without abnormal findings: Secondary | ICD-10-CM | POA: Diagnosis not present

## 2021-03-08 DIAGNOSIS — E785 Hyperlipidemia, unspecified: Secondary | ICD-10-CM | POA: Diagnosis not present

## 2021-03-08 DIAGNOSIS — E0500 Thyrotoxicosis with diffuse goiter without thyrotoxic crisis or storm: Secondary | ICD-10-CM

## 2021-03-08 DIAGNOSIS — M1811 Unilateral primary osteoarthritis of first carpometacarpal joint, right hand: Secondary | ICD-10-CM

## 2021-03-08 NOTE — Patient Instructions (Addendum)
Thanks for doing labs If you have mychart- we will send your results within 3 business days of [place] receiving them.  If you do not have mychart- we will call you about results within 5 business days of [place] receiving them.  *please also note that you will see labs on mychart as soon as they post. I will later go in and write notes on them- will say "notes from Dr. Durene Cal"   Recommended follow up: Return in about 1 year (around 03/08/2022) for physical or sooner if needed.

## 2021-03-09 LAB — COMPREHENSIVE METABOLIC PANEL
ALT: 15 U/L (ref 0–35)
AST: 24 U/L (ref 0–37)
Albumin: 4.3 g/dL (ref 3.5–5.2)
Alkaline Phosphatase: 67 U/L (ref 39–117)
BUN: 9 mg/dL (ref 6–23)
CO2: 30 mEq/L (ref 19–32)
Calcium: 9.6 mg/dL (ref 8.4–10.5)
Chloride: 105 mEq/L (ref 96–112)
Creatinine, Ser: 0.72 mg/dL (ref 0.40–1.20)
GFR: 89.14 mL/min (ref >60.00)
Glucose, Bld: 79 mg/dL (ref 70–99)
Potassium: 3.9 mEq/L (ref 3.5–5.1)
Sodium: 140 mEq/L (ref 135–145)
Total Bilirubin: 0.9 mg/dL (ref 0.2–1.2)
Total Protein: 6.8 g/dL (ref 6.0–8.3)

## 2021-03-09 LAB — CBC WITH DIFFERENTIAL/PLATELET
Basophils Absolute: 0.1 10*3/uL (ref 0.0–0.1)
Basophils Relative: 1.4 % (ref 0.0–3.0)
Eosinophils Absolute: 0.1 10*3/uL (ref 0.0–0.7)
Eosinophils Relative: 1 % (ref 0.0–5.0)
HCT: 43.1 % (ref 36.0–46.0)
Hemoglobin: 14.5 g/dL (ref 12.0–15.0)
Lymphocytes Relative: 40 % (ref 12.0–46.0)
Lymphs Abs: 2.4 10*3/uL (ref 0.7–4.0)
MCHC: 33.5 g/dL (ref 30.0–36.0)
MCV: 94.3 fl (ref 78.0–100.0)
Monocytes Absolute: 0.4 10*3/uL (ref 0.1–1.0)
Monocytes Relative: 6.7 % (ref 3.0–12.0)
Neutro Abs: 3 10*3/uL (ref 1.4–7.7)
Neutrophils Relative %: 50.9 % (ref 43.0–77.0)
Platelets: 231 10*3/uL (ref 150.0–400.0)
RBC: 4.58 Mil/uL (ref 3.87–5.11)
RDW: 12.8 % (ref 11.5–15.5)
WBC: 6 10*3/uL (ref 4.0–10.5)

## 2021-03-09 LAB — T3, FREE: T3, Free: 3.1 pg/mL (ref 2.3–4.2)

## 2021-03-09 LAB — LIPID PANEL
Cholesterol: 190 mg/dL (ref 0–200)
HDL: 66 mg/dL (ref >39.00)
LDL Cholesterol: 111 mg/dL — ABNORMAL HIGH (ref 0–99)
NonHDL: 124.15
Total CHOL/HDL Ratio: 3
Triglycerides: 67 mg/dL (ref 0.0–149.0)
VLDL: 13.4 mg/dL (ref 0.0–40.0)

## 2021-03-09 LAB — TSH: TSH: 1.21 u[IU]/mL (ref 0.35–5.50)

## 2021-03-09 LAB — T4, FREE: Free T4: 0.94 ng/dL (ref 0.60–1.60)

## 2021-04-14 ENCOUNTER — Other Ambulatory Visit: Payer: Self-pay | Admitting: Family Medicine

## 2021-04-14 IMAGING — DX DG HAND COMPLETE 3+V*R*
3 series · 3 of 3 positions shown · non-contrast
Comparison: No recent prior.

CLINICAL DATA: Right hand pain.

EXAM:
RIGHT HAND - COMPLETE 3+ VIEW

[hand ap]
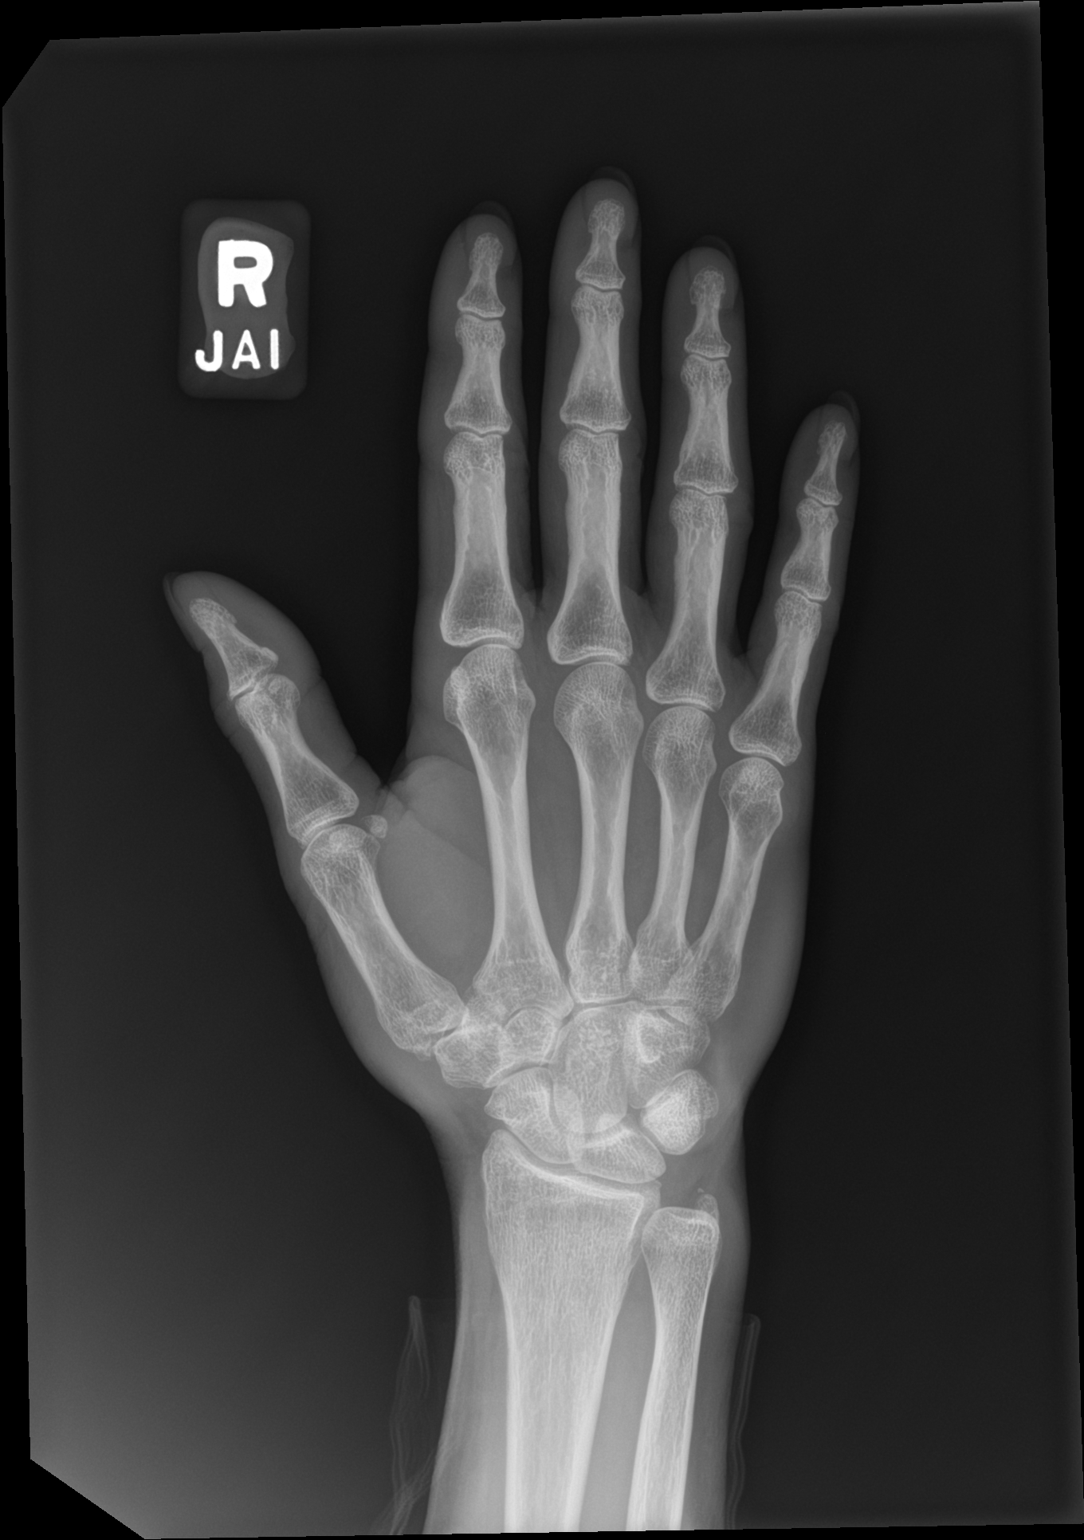

[hand obl]
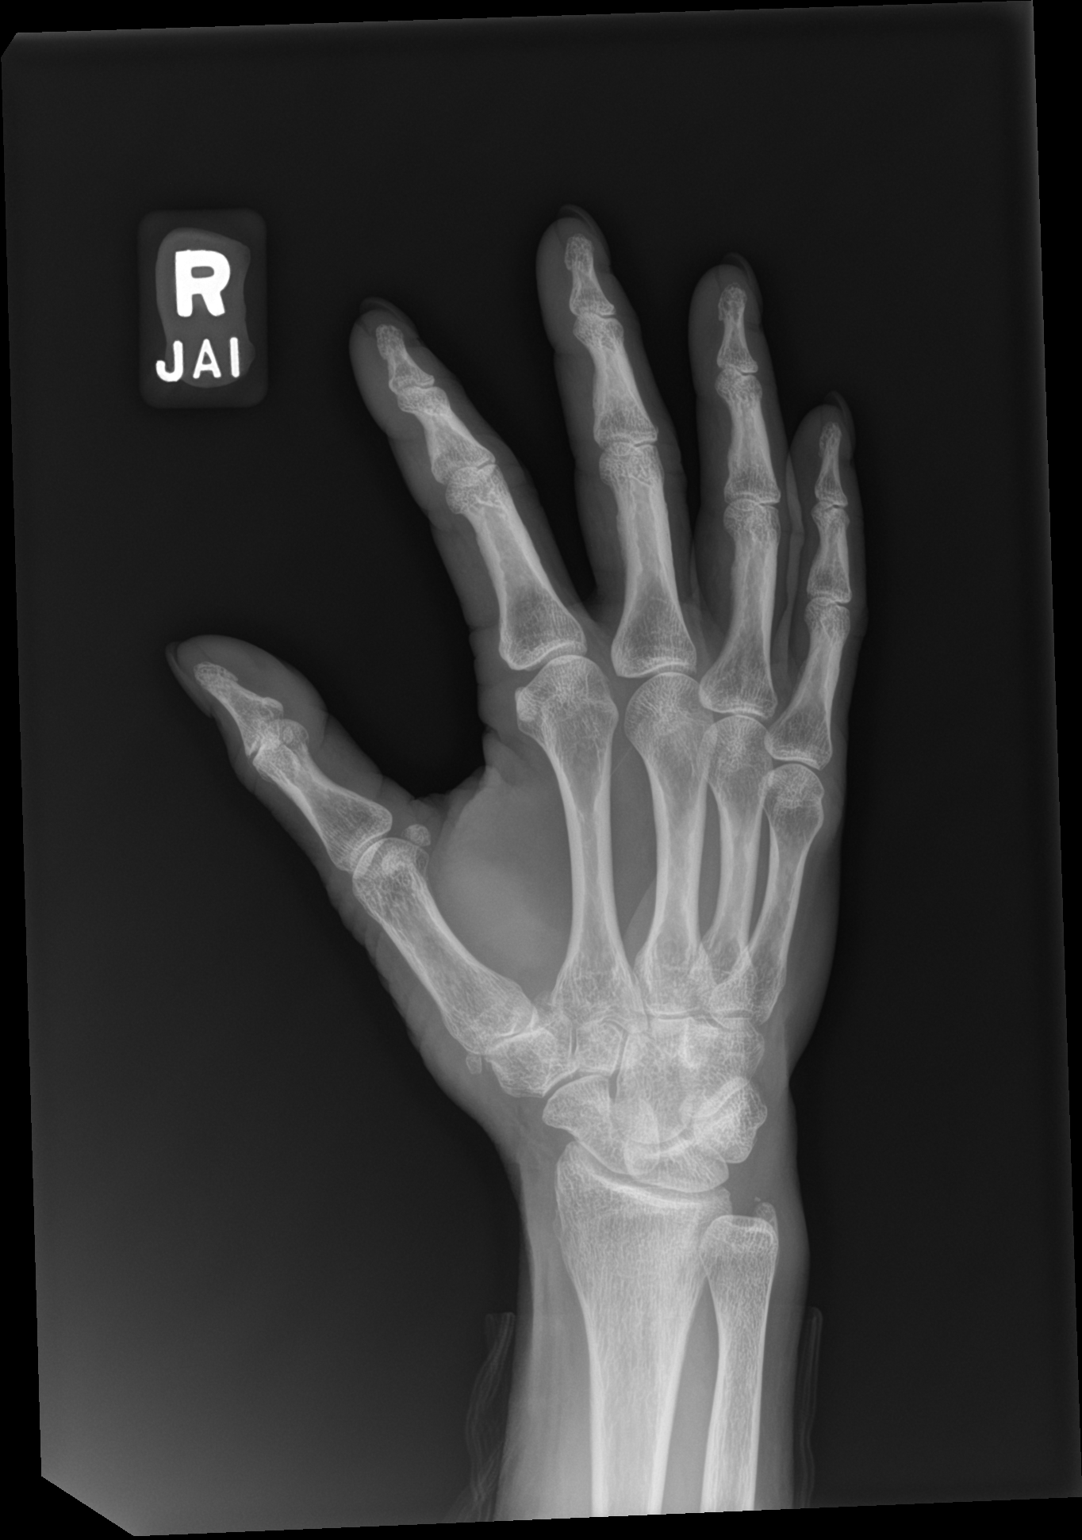

[hand lat]
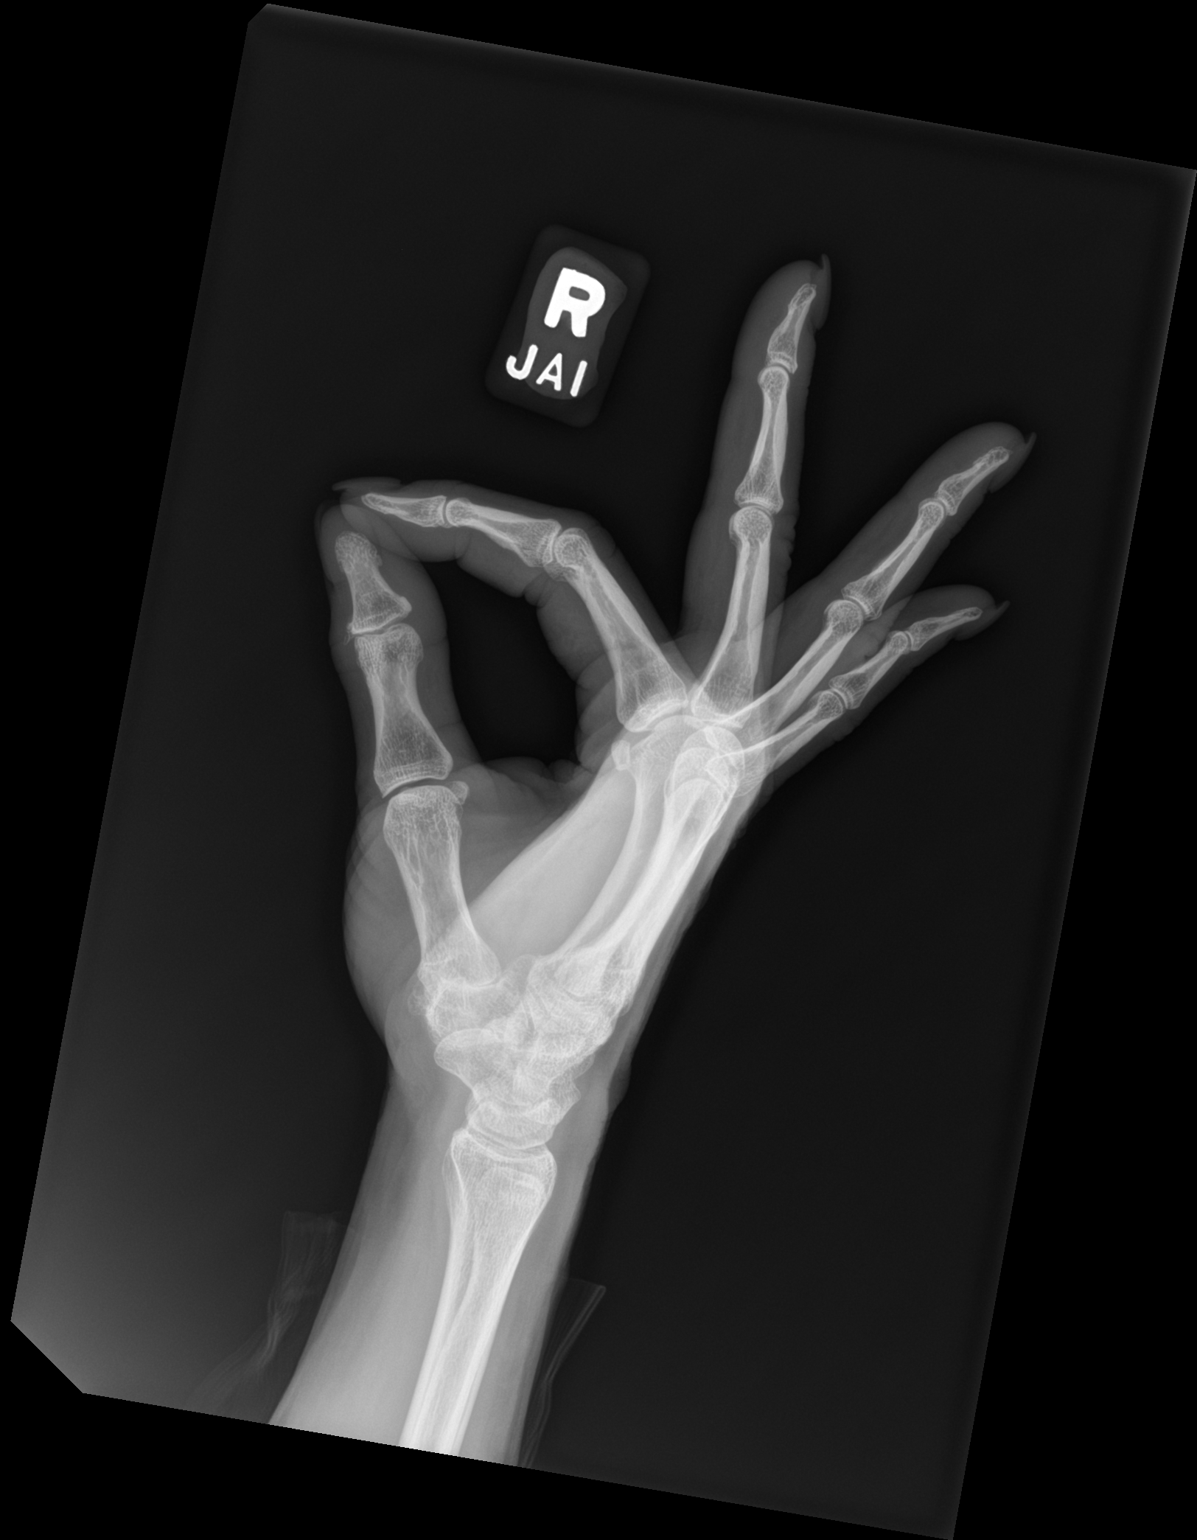

[3 of 3 positions shown; findings below may reference images not displayed]

FINDINGS: Tiny bony density noted adjacent to the ulnar styloid. This could
represent a tiny fracture fragment, age undetermined. No other acute
bony abnormalities identified. Mild radiocarpal, first
carpometacarpal, and first distal interphalangeal joint degenerative
change. Mild periarticular osteopenia noted, however there are no
periarticular erosive changes noted. No radiopaque foreign body.
IMPRESSION: 1. Tiny bony density noted adjacent to the ulnar styloid. This could
represent a tiny fracture fragment, age undetermined. No other acute
bony abnormalities identified.

2. Mild radiocarpal, first carpometacarpal, and first distal
interphalangeal joint degenerative change. Mild periarticular
osteopenia noted, however there are no periarticular erosive changes
present.

## 2021-05-17 NOTE — Progress Notes (Signed)
?Kristine [name] D.O. ?[facility] ?[address] [place] [address] ?Phone: [phone] ?Subjective:   ?I, Kristine [name], am serving as a scribe for Dr. Antoine [name]. ? ?This visit occurred during the SARS-CoV-2 public health emergency.  Safety protocols were in place, including screening questions prior to the visit, additional usage of staff PPE, and extensive cleaning of exam room while observing appropriate contact time as indicated for disinfecting solutions.  ? ?I'm seeing this patient by the request  of:  Kristine Majestic, MD ? ?CC: Left hand pain ? ?[id number]  ?Kristine [name] is a 66 y.o. female coming in with complaint of L hand pain. Ganglion cyst injected in Nov  2022. Patient states that injection in November worked well. Believes ganglion cyst is back and is having numbness in middle finger.  ? ?Pain in lumbar spine and R hip after riding her spin bike. Unable to walk 2 blocks last week. Pain is better this week though.  ? ?Also having L elbow pain from her dog pulling on her hand. Painful to pick up item in hand and to flex elbow.  ? ? ? ?  ? ?Past Medical History:  ?Diagnosis Date  ? Allergy   ? GRAVE'S DISEASE 01/24/2007  ? ?Past Surgical History:  ?Procedure Laterality Date  ? CARPAL TUNNEL RELEASE    ? 3-4 yeras ago from 2016  ? COLONOSCOPY  2010, 2016  ? polyp in 2010, none 2016  ? THORACOTOMY  1984  ? duplicate esophagus  ? TONSILLECTOMY  1972  ? ?Social History  ? ?Socioeconomic History  ? Marital status: Married  ?  Spouse name: Not on file  ? Number of children: Not on file  ? Years of education: Not on file  ? Highest education level: Not on file  ?Occupational History  ? Not on file  ?Tobacco Use  ? Smoking status: Never  ? Smokeless tobacco: Never  ?Substance and Sexual Activity  ? Alcohol use: No  ?  Alcohol/week: 0.0 standard drinks  ? Drug use: No  ? Sexual activity: Not on file  ?Other Topics Concern  ? Not on file  ?Social History Narrative  ? Married. 68  (grandson [phone]) and 6 year old daughters (grandson in late 2020) in 2016. Both live close.   ? From [place] originally  ?   ? 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   ? Hobbies: read, walk dog, yoga, movies, steelers in the fall  ? ?Social Determinants of Health  ? ?Financial Resource Strain: Not on file  ?Food Insecurity: Not on file  ?Transportation Needs: Not on file  ?Physical Activity: Not on file  ?Stress: Not on file  ?Social Connections: Not on file  ? ?No Known Allergies ?Family History  ?Problem Relation Age of Onset  ? Heart disease Father   ?     74 MI- 4 stents incl. LAD, former smoker distant  ? Lung disease Mother   ?     "shock lung"  ? Colon cancer Neg Hx   ? Colon polyps Neg Hx   ? Rectal cancer Neg Hx   ? Stomach cancer Neg Hx   ? Esophageal cancer Neg Hx   ? ? ?Current Outpatient Medications (Endocrine & Metabolic):  ?  methimazole (TAPAZOLE) 10 MG tablet, TAKE 1/2 TABLET BY MOUTH EVERY DAY ? ? ?Current Outpatient Medications (Respiratory):  ?  diphenhydrAMINE (BENADRYL) 25 MG tablet, Take 25 mg by mouth daily. ?  loratadine (CLARITIN) 10 MG tablet, Take 10 mg by mouth daily. ? ? ? ?Current Outpatient Medications (Other):  ?  Cholecalciferol (VITAMIN D3 PO), Take by mouth. ?  dorzolamide-timolol (COSOPT) 22.3-6.8 MG/ML ophthalmic solution, Place 1 drop into both eyes daily. Through optho ?  Misc Natural Products (TURMERIC CURCUMIN) CAPS, Take 1 capsule by mouth daily. ?  Multiple Vitamin (MULTIVITAMIN) tablet, Take 1 tablet by mouth daily. ?  Omega-3 Fatty Acids (FISH OIL) 500 MG CAPS, Take by mouth. 1200 mg daily ?  Tart Cherry 1200 MG CAPS,  ? ? ?Reviewed prior external information including notes and imaging from  ?primary care provider ?As well as notes that were available from care everywhere and other healthcare systems. ? ?Past medical history, social, surgical and family history all reviewed in electronic medical record.  No pertanent information  unless stated regarding to the chief complaint.  ? ?Review of Systems: ? No headache, visual changes, nausea, vomiting, diarrhea, constipation, dizziness, abdominal pain, skin rash, fevers, chills, night sweats, weight loss, swollen lymph nodes, body aches, joint swelling, chest pain, shortness of breath, mood changes. POSITIVE muscle aches ? ?Objective  ?Blood pressure 102/68, pulse (!) 52, height 5\' 5"  (1.651 m), weight 123 lb (55.8 kg), last menstrual period 03/31/2010, SpO2 98 %. ?  ?General: No apparent distress alert and oriented x3 mood and affect normal, dressed appropriately.  ?HEENT: Pupils equal, extraocular movements intact  ?Respiratory: Patient's speak in full sentences and does not appear short of breath  ?Cardiovascular: No lower extremity edema, non tender, no erythema  ?Gait normal with good balance and coordination.  ?MSK: Left hand pain noted over the palmar aspect near the in the joint of the third finger. ?Likely cyst formation noted again at this time. ? ?Exam does have some loss of lordosis.  Tenderness to palpation minorly over the right gluteal area.  Some over the greater trochanteric area.  Mild tightness in the back but overall good core strength noted. ? ?Left elbow exam has good range of motion.  Positive Tinel's sign noted noted.  Worsening pain over the ulnar nerve itself.  Not as much pain though with flexion and extension against resistance of the elbow. ? ?Procedure: Real-time Ultrasound Guided Injection of left finger cyst ?Device: GE Logiq Q7 ?Ultrasound guided injection is preferred based studies that show increased duration, increased effect, greater accuracy, decreased procedural pain, increased response rate, and decreased cost with ultrasound guided versus blind injection.  ?Verbal informed consent obtained.  ?Time-out conducted.  ?Noted no overlying erythema, induration, or other signs of local infection.  ?Skin prepped in a sterile fashion.  ?Local anesthesia: Topical  Ethyl chloride.  ?With sterile technique and under real time ultrasound guidance: With a 25-gauge half inch needle injected into the flexor tendon sheath of the third middle finger where cyst formation appeared over the hypoechoic changes were.  A total of 0.5 cc of 0.5% Marcaine and 0.5 cc of Kenalog 40 mg/mL used ?Completed without difficulty  ?Pain immediately improved suggesting accurate placement of the medication.  ?Advised to call if fevers/chills, erythema, induration, drainage, or persistent bleeding.  ?Impression: Technically successful ultrasound guided injection. ? ?  ?Impression and Recommendations:  ?  ? ?The above documentation has been reviewed and is accurate and complete 04/02/2010, DO ? ? ? ?

## 2021-05-18 ENCOUNTER — Ambulatory Visit (INDEPENDENT_AMBULATORY_CARE_PROVIDER_SITE_OTHER): Payer: Managed Care, Other (non HMO) | Admitting: Family Medicine

## 2021-05-18 ENCOUNTER — Ambulatory Visit: Payer: Self-pay

## 2021-05-18 ENCOUNTER — Encounter: Payer: Self-pay | Admitting: Family Medicine

## 2021-05-18 VITALS — BP 102/68 | HR 52 | Ht 65.0 in | Wt 123.0 lb

## 2021-05-18 DIAGNOSIS — M545 Low back pain, unspecified: Secondary | ICD-10-CM | POA: Diagnosis not present

## 2021-05-18 DIAGNOSIS — M79642 Pain in left hand: Secondary | ICD-10-CM | POA: Diagnosis not present

## 2021-05-18 DIAGNOSIS — G5622 Lesion of ulnar nerve, left upper limb: Secondary | ICD-10-CM | POA: Diagnosis not present

## 2021-05-18 DIAGNOSIS — M67442 Ganglion, left hand: Secondary | ICD-10-CM | POA: Diagnosis not present

## 2021-05-18 NOTE — Assessment & Plan Note (Signed)
Repeat injection given today, has tolerated this procedure previously.  Patient has hopefully will make improvement again.  We discussed that the walking resolved with the weight that she is doing it is likely contributing to more flexion and area.  This would increase the likelihood of accumulation.  Follow-up with me again in 2 to 3 months otherwise. ?

## 2021-05-18 NOTE — Patient Instructions (Addendum)
Injected hand today ?Exercises ?Compression sleeve ?Angel lead leash ?See me again if needed in 2-3 months ?

## 2021-05-18 NOTE — Assessment & Plan Note (Signed)
Patient has had low back pain previously.  Has had more of a nerve injury from time to time on the right side of the lower back in the buttocks area.  Patient should do relatively well but unfortunately if starts getting more pain continue to consider the possibility of an L4-L5 right-sided epidural again.  Patient wants to hold at this time and continue with the core strengthening which patient has done well with. ?

## 2021-05-18 NOTE — Assessment & Plan Note (Signed)
Likely no weakness but does seem to be more secondary to an injury potentially with walking the dog, discussed compression and work with Product/process development scientist to learn home exercises.  Increase activity slowly.  Follow-up with me again in 6 to 8 weeks.  Worsening pain can consider the possibility of injection or the possibility of gabapentin medications. ?

## 2021-07-15 NOTE — Progress Notes (Deleted)
[name] [address] [place] [address] Phone: [phone] Subjective:    I'm seeing this patient by the request  of:  Kristine Majestic, MD  CC:   [id number]  05/18/2021 Likely no weakness but does seem to be more secondary to an injury potentially with walking the dog, discussed compression and work with athletic trainer to learn home exercises.  Increase activity slowly.  Follow-up with me again in 6 to 8 weeks.  Worsening pain can consider the possibility of injection or the possibility of gabapentin medications.  Patient has had low back pain previously.  Has had more of a nerve injury from time to time on the right side of the lower back in the buttocks area.  Patient should do relatively well but unfortunately if starts getting more pain continue to consider the possibility of an L4-L5 right-sided epidural again.  Patient wants to hold at this time and continue with the core strengthening which patient has done well with.  Repeat injection given today, has tolerated this procedure previously.  Patient has hopefully will make improvement again.  We discussed that the walking resolved with the weight that she is doing it is likely contributing to more flexion and area.  This would increase the likelihood of accumulation.  Follow-up with me again in 2 to 3 months otherwise.  Updated 07/20/2021 Kristine [name] is a 66 y.o. female coming in with complaint of left elbow, hand, and LBP.       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 20 (grandson 3937) and 57 year old daughters (grandson in late 2020) in 2016. Both live close.    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 LAD, former smoker distant   Lung disease Mother        "shock lung"   Colon cancer Neg Hx    Colon polyps Neg Hx  Rectal cancer Neg Hx    Stomach cancer Neg Hx    Esophageal cancer Neg Hx     Current Outpatient Medications (Endocrine & Metabolic):    methimazole (TAPAZOLE) 10 MG tablet, TAKE 1/2 TABLET BY MOUTH EVERY DAY   Current Outpatient Medications (Respiratory):    diphenhydrAMINE (BENADRYL) 25 MG tablet, Take 25 mg by mouth daily.   loratadine (CLARITIN) 10 MG tablet, Take 10 mg by mouth daily.    Current Outpatient Medications (Other):    Cholecalciferol (VITAMIN D3 PO), Take by mouth.   dorzolamide-timolol (COSOPT) 22.3-6.8 MG/ML ophthalmic solution, Place 1 drop into both eyes daily. Through optho   Misc Natural Products (TURMERIC CURCUMIN) CAPS, Take 1 capsule by mouth daily.   Multiple Vitamin (MULTIVITAMIN) tablet, Take 1 tablet by mouth daily.   Omega-3 Fatty Acids (FISH  OIL) 500 MG CAPS, Take by mouth. 1200 mg daily   Tart Cherry 1200 MG CAPS,    Reviewed prior external information including notes and imaging from  primary care provider As well as notes that were available from care everywhere and other healthcare systems.  Past medical history, social, surgical and family history all reviewed in electronic medical record.  No pertanent information unless stated regarding to the chief complaint.   Review of Systems:  No headache, visual changes, nausea, vomiting, diarrhea, constipation, dizziness, abdominal pain, skin rash, fevers, chills, night sweats, weight loss, swollen lymph nodes, body aches, joint swelling, chest pain, shortness of breath, mood changes. POSITIVE muscle aches  Objective  Last menstrual period 03/31/2010.   General: No apparent distress alert and oriented x3 mood and affect normal, dressed appropriately.  HEENT: Pupils equal, extraocular movements intact  Respiratory: Patient's speak in full sentences and does not appear short of breath  Cardiovascular: No lower extremity edema, non tender, no erythema  Gait normal with good balance and coordination.  MSK:      Impression and Recommendations:     The above documentation has been reviewed and is accurate and complete Judi Saa, DO

## 2021-07-19 ENCOUNTER — Encounter: Payer: Self-pay | Admitting: Physician Assistant

## 2021-07-19 ENCOUNTER — Ambulatory Visit (INDEPENDENT_AMBULATORY_CARE_PROVIDER_SITE_OTHER): Payer: Managed Care, Other (non HMO) | Admitting: Physician Assistant

## 2021-07-19 VITALS — BP 90/60 | HR 53 | Temp 98.4°F | Ht 65.0 in | Wt 125.0 lb

## 2021-07-19 DIAGNOSIS — R051 Acute cough: Secondary | ICD-10-CM | POA: Diagnosis not present

## 2021-07-19 MED ORDER — AZITHROMYCIN 250 MG PO TABS: ORAL_TABLET | ORAL | 0 refills | Status: AC

## 2021-07-19 NOTE — Progress Notes (Signed)
Kristine [name] is a 66 y.o. female here for a new problem.  History of Present Illness:   Chief Complaint  Patient presents with   Cough    Pt c/o cough stared on Thursday and has progressively getting worse. Pt has tried Delysm, Robitussin DM and Adviar Inhaler. Did COVID test on Friday was Neg.     HPI  Cough Patient complain of dry non-productive cough that has been onset for the past 5 days. She states symptoms seems to be getting worse. States she went to [place] via airplane prior to this starting. Has been feeling some "tickle" in her chest. She has tried taking Delsym, Robitussin DM and Advair inhaler 3 times for the past 2 days with no improvement. Covid test at home was negative. Some wheezing. Has had sore throat on Friday but this has resolved. She has tried hydrocodone syrup with some relief last night. No fevers or chills.  No shortness of breath.  No nasal congestion.  No heartburn or reflux. No other specific treatments tried. No known sick contacts.   Feels like the [place] wildfires contributed to her symptoms.  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 LAD, former smoker distant   Lung disease Mother        "shock lung"   Colon cancer Neg Hx    Colon polyps Neg Hx    Rectal cancer Neg Hx    Stomach cancer Neg Hx    Esophageal cancer Neg Hx     No Known Allergies  Current Medications:   Current  Outpatient Medications:    azithromycin (ZITHROMAX) 250 MG tablet, Take 2 tablets on day 1, then 1 tablet daily on days 2 through 5, Disp: 6 tablet, Rfl: 0   Cholecalciferol (VITAMIN D3 PO), Take by mouth., Disp: , Rfl:    diphenhydrAMINE (BENADRYL) 25 MG tablet, Take 25 mg by mouth daily., Disp: , Rfl:    dorzolamide-timolol (COSOPT) 22.3-6.8 MG/ML ophthalmic solution, Place 1 drop into both eyes daily. Through optho, Disp: 1 mL, Rfl: 0   loratadine (CLARITIN) 10 MG tablet, Take 10 mg by mouth daily., Disp: , Rfl:    methimazole (TAPAZOLE) 10 MG tablet, TAKE 1/2 TABLET BY MOUTH EVERY DAY, Disp: 45 tablet, Rfl: 3   Misc Natural Products (TURMERIC CURCUMIN) CAPS, Take 1 capsule by mouth daily., Disp: , Rfl:    Multiple Vitamin (MULTIVITAMIN) tablet, Take 1 tablet by mouth daily., Disp: , Rfl:    Omega-3 Fatty Acids (FISH OIL) 500 MG CAPS, Take by mouth. 1200 mg daily, Disp: , Rfl:    Tart Cherry 1200 MG CAPS, , Disp: , Rfl:    Review of Systems:   ROS Negative unless otherwise specified per HPI.   Vitals:  Vitals:   07/19/21 1352  BP: 90/60  Pulse: (!) 53  Temp: 98.4 F (36.9 C)  TempSrc: Temporal  SpO2: 98%  Weight: 125 lb (56.7 kg)  Height: 5\' 5"  (1.651 m)     Body mass index is 20.8 kg/m.  Physical Exam:   Physical Exam Vitals and nursing note reviewed.  Constitutional:      General: She is not in acute distress.    Appearance: She is well-developed. She is not ill-appearing or toxic-appearing.  HENT:     Head: Normocephalic and atraumatic.     Right Ear: Tympanic membrane, ear canal and external ear normal. Tympanic membrane is not erythematous, retracted or bulging.     Left Ear: Tympanic membrane, ear canal and external ear normal. Tympanic membrane is not erythematous, retracted or bulging.     Nose: Nose normal.     Right Sinus: No maxillary sinus tenderness or frontal sinus tenderness.     Left Sinus: No maxillary sinus tenderness or frontal sinus tenderness.      Mouth/Throat:     Pharynx: Uvula midline. No posterior oropharyngeal erythema.  Eyes:     General: Lids are normal.     Conjunctiva/sclera: Conjunctivae normal.  Neck:     Trachea: Trachea normal.  Cardiovascular:     Rate and Rhythm: Normal rate and regular rhythm.     Pulses: Normal pulses.     Heart sounds: Normal heart sounds, S1 normal and S2 normal.  Pulmonary:     Effort: Pulmonary effort is normal.     Breath sounds: Examination of the left-upper field reveals wheezing. Wheezing present. No decreased breath sounds, rhonchi or rales.  Lymphadenopathy:     Cervical: No cervical adenopathy.  Skin:    General: Skin is warm and dry.  Neurological:     Mental Status: She is alert.     GCS: GCS eye subscore is 4. GCS verbal subscore is 5. GCS motor subscore is 6.  Psychiatric:        Speech: Speech normal.        Behavior: Behavior normal. Behavior is cooperative.     Assessment and Plan:   Acute cough No red flags on exam.  I suspect a component of reactive airway disease and possible viral URI. Will initiate Advair 1 puff BID -- she already has this at home. She cannot tolerate oral prednisone due to her Graves. If no improvement or any worsening, recommend starting z-pack. Reviewed return precautions including worsening fever, SOB, worsening cough or other concerns. Push fluids and rest. I recommend that patient follow-up if symptoms worsen or persist despite treatment x 7-10 days, sooner if needed.  I,Savera Zaman,acting as a for Neurosurgeon, PA.,have documented all relevant documentation on the behalf of [employer], PA,as directed by  Jarold Motto, PA while in the presence of Jarold Motto, Jarold Motto.   I, [place], Jarold Motto, have reviewed all documentation for this visit. The documentation on 07/19/21 for the exam, diagnosis, procedures, and orders are all accurate and complete.   09/18/21, PA-C

## 2021-07-19 NOTE — Patient Instructions (Signed)
It was great to see you!  You have a viral upper respiratory infection. Antibiotics are not needed for this.  Viral infections usually take 7-10 days to resolve.  The cough can last a few weeks to go away.  Start advair more consistently -- twice daily  If no significant improvement in the next few days or any worsening in the meantime -- take z-pack antibiotic   Push fluids and get plenty of rest. Please return if you are not improving as expected, or if you have high fevers (>101.5) or difficulty swallowing or worsening productive cough.  Call clinic with questions.  I hope you start feeling better soon!

## 2021-07-20 ENCOUNTER — Encounter: Payer: Self-pay | Admitting: Physician Assistant

## 2021-07-20 ENCOUNTER — Ambulatory Visit: Payer: Managed Care, Other (non HMO) | Admitting: Family Medicine

## 2021-07-20 DIAGNOSIS — R053 Chronic cough: Secondary | ICD-10-CM

## 2021-07-23 ENCOUNTER — Other Ambulatory Visit: Payer: Self-pay | Admitting: Physician Assistant

## 2021-07-23 MED ORDER — HYDROCOD POLI-CHLORPHE POLI ER 10-8 MG/5ML PO SUER: 5.0000 mL | Freq: Two times a day (BID) | ORAL | 0 refills | Status: DC | PRN

## 2021-07-28 ENCOUNTER — Ambulatory Visit (INDEPENDENT_AMBULATORY_CARE_PROVIDER_SITE_OTHER): Payer: Managed Care, Other (non HMO) | Admitting: Physician Assistant

## 2021-07-28 ENCOUNTER — Encounter: Payer: Self-pay | Admitting: Physician Assistant

## 2021-07-28 VITALS — BP 100/70 | HR 57 | Temp 97.4°F | Ht 65.0 in | Wt 124.0 lb

## 2021-07-28 DIAGNOSIS — R052 Subacute cough: Secondary | ICD-10-CM

## 2021-07-28 MED ORDER — IPRATROPIUM BROMIDE 0.06 % NA SOLN: 2.0000 | Freq: Two times a day (BID) | NASAL | 1 refills | Status: DC

## 2021-07-28 NOTE — Progress Notes (Signed)
Kristine [name] is a 66 y.o. female here for a follow up on cough.  History of Present Illness:   Chief Complaint  Patient presents with   Cough    Pt is still c/o persistent cough,during the day, expectorating yellow/green sputum. Denies fever or chills. She has been using Delysm     HPI  Cough Patient complain of persistent cough that has been onset for past few weeks. She was seen for similar issue on 07/19/2021. She was prescribed course of Z-pack at that time. Today, she notes her symptoms has not improved. States she she has finished Z-pack on 07/23/2021 and was feeling better.   However, her symptoms have recur since this Monday. She has been coughing up some yellow/green sputum. States she has been coughing more during the day and her symptoms are manageable at night. Does feel some tickle feeling in her throat causing her constant cough. She has been using Advair inhaler twice daily and Delsym with no relief. She has been using Claritin 10 mg daily for her allergies. She does have a hx of allergic rhinitis. Denies fever or chills. No shortness of breath.  No nasal congestion.  No heartburn or reflux. No other specific treatments tried.   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 LAD, former smoker distant   Lung disease Mother        "shock lung"   Colon cancer Neg Hx    Colon polyps Neg Hx    Rectal cancer Neg Hx     Stomach cancer Neg Hx    Esophageal cancer Neg Hx     No Known Allergies  Current Medications:   Current Outpatient Medications:    chlorpheniramine-HYDROcodone (TUSSIONEX PENNKINETIC ER) 10-8 MG/5ML, Take 5 mLs by mouth every 12 (twelve) hours as needed for cough., Disp: 115 mL, Rfl: 0   Cholecalciferol (VITAMIN D3 PO), Take by mouth., Disp: , Rfl:    diphenhydrAMINE (BENADRYL) 25 MG tablet, Take 25 mg by mouth daily., Disp: , Rfl:    dorzolamide-timolol (COSOPT) 22.3-6.8 MG/ML ophthalmic solution, Place 1 drop into both eyes daily. Through optho, Disp: 1 mL, Rfl: 0   fluticasone-salmeterol (ADVAIR DISKUS) 100-50 MCG/ACT AEPB, Inhale 1 puff into the lungs 2 (two) times daily., Disp: , Rfl:    loratadine (CLARITIN) 10 MG tablet, Take 10 mg by mouth daily., Disp: , Rfl:    methimazole (TAPAZOLE) 10 MG tablet, TAKE 1/2 TABLET BY MOUTH EVERY DAY, Disp: 45 tablet, Rfl: 3   Misc Natural Products (TURMERIC CURCUMIN) CAPS, Take 1 capsule by mouth daily., Disp: , Rfl:    Multiple Vitamin (MULTIVITAMIN) tablet, Take 1 tablet by mouth daily., Disp: , Rfl:    Omega-3 Fatty Acids (FISH OIL) 500 MG CAPS, Take  by mouth. 1200 mg daily, Disp: , Rfl:    Tart Cherry 1200 MG CAPS, , Disp: , Rfl:    Review of Systems:   ROS Negative unless otherwise specified per HPI.   Vitals:   Vitals:   07/28/21 1038  BP: 100/70  Pulse: (!) 57  Temp: (!) 97.4 F (36.3 C)  TempSrc: Temporal  SpO2: 97%  Weight: 124 lb (56.2 kg)  Height: 5\' 5"  (1.651 m)     Body mass index is 20.63 kg/m.  Physical Exam:   Physical Exam Vitals and nursing note reviewed.  Constitutional:      General: She is not in acute distress.    Appearance: She is well-developed. She is not ill-appearing or toxic-appearing.  Cardiovascular:     Rate and Rhythm: Normal rate and regular rhythm.     Pulses: Normal pulses.     Heart sounds: Normal heart sounds, S1 normal and S2 normal.  Pulmonary:     Effort: Pulmonary effort is  normal.     Breath sounds: Normal breath sounds.  Skin:    General: Skin is warm and dry.  Neurological:     Mental Status: She is alert.     GCS: GCS eye subscore is 4. GCS verbal subscore is 5. GCS motor subscore is 6.  Psychiatric:        Speech: Speech normal.        Behavior: Behavior normal. Behavior is cooperative.     Assessment and Plan:   Subacute cough Slowly improving Vitals are stable and lung exam WNL No red flags Recommend continued patience, delsym, fluids, inhaler Trial atrovent nasal spray Consider switching anti-histamine to xyzal Follow-up as needed Consider CXR if persists   I,Savera Zaman,acting as a scribe for , PA.,have documented all relevant documentation on the behalf of [employer], PA,as directed by  Jarold Motto, PA while in the presence of Jarold Motto, Jarold Motto.   I, [place], Jarold Motto, have reviewed all documentation for this visit. The documentation on 07/28/21 for the exam, diagnosis, procedures, and orders are all accurate and complete.   07/30/21, PA-C

## 2021-07-28 NOTE — Patient Instructions (Addendum)
It was great to see you!  Atrovent twice daily after your saline  Consider switch of antihistamine to Xyzal (levocetirizine)  Continue delsym -- 12 hour  Keep me posted  Take care,  Jarold Motto PA-C

## 2021-08-05 ENCOUNTER — Other Ambulatory Visit: Payer: Self-pay | Admitting: Physician Assistant

## 2021-08-05 MED ORDER — FLUTICASONE-SALMETEROL 250-50 MCG/ACT IN AEPB: 1.0000 | INHALATION_SPRAY | Freq: Two times a day (BID) | RESPIRATORY_TRACT | 1 refills | Status: DC

## 2021-08-19 ENCOUNTER — Other Ambulatory Visit: Payer: Self-pay | Admitting: Physician Assistant

## 2021-08-24 NOTE — Progress Notes (Unsigned)
[facility] [place] Phone: [phone] Subjective:   Kristine [name], am serving as a scribe for Dr. Hulan Saas.  I'm seeing this patient by the request  of:  Marin Olp, MD  CC: Hand pain, back pain  [id number]  05/18/2021 Repeat injection given today, has tolerated this procedure previously.  Patient has hopefully will make improvement again.  We discussed that the walking resolved with the weight that she [name] doing it [name] likely contributing to more flexion and area.  This would increase the likelihood of accumulation.  Follow-up with me again in 2 to 3 months otherwise.  Patient has had low back pain previously.  Has had more of a nerve injury from time to time on the right side of the lower back in the buttocks area.  Patient should do relatively well but unfortunately if starts getting more pain continue to consider the possibility of an L4-L5 right-sided epidural again.  Patient wants to hold at this time and continue with the core strengthening which patient has done well with.  Likely [name] weakness but does seem to be more secondary to an injury potentially with walking the dog, discussed compression and work with Product/process development scientist to learn home exercises.  Increase activity slowly.  Follow-up with me again in 6 to 8 weeks.  Worsening pain can consider the possibility of injection or the possibility of gabapentin medications.  Updated 08/25/2021 Kristine [name] a 66 y.o. [name] coming in with complaint of trigger finger on R ring and L middle finger pain. History of cyst in L middle finger.   Pain in R lumbar spine [name] persistent. Patient has been stiff due to not active due to illness. Epidural was given previously and had responded extremely well.  States that it feels very similar and just worsening at the moment.  Elbow pain has resolved after using leash for her doge.        Past Medical History:   Diagnosis Date   Allergy    GRAVE'S DISEASE 01/24/2007   Past Surgical History:  Procedure Laterality Date   CARPAL TUNNEL RELEASE     3-4 yeras ago from 2016   COLONOSCOPY  2010, 2016   polyp in 2010, none 2016   THORACOTOMY  [id number]   duplicate esophagus   TONSILLECTOMY  1972   Social History   Socioeconomic History   Marital status: Married    Spouse name: Not on file   Number of children: Not on file   Years of education: Not on file   Highest education level: Not on file  Occupational History   Not on file  Tobacco Use   Smoking status: Never   Smokeless tobacco: Never  Substance and Sexual Activity   Alcohol use: [name]    Alcohol/week: 0.0 standard drinks of alcohol   Drug use: [name]   Sexual activity: Not on file  Other Topics Concern   Not on file  Social History Narrative   Married. 13 (grandson 6750) and 60 year old daughters (grandson in late 2020) in 2016. Both live close.    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 LAD, former smoker distant   Lung disease Mother        "shock lung"   Colon cancer Neg Hx    Colon polyps Neg Hx    Rectal cancer Neg Hx    Stomach cancer Neg Hx    Esophageal cancer Neg Hx     Current Outpatient Medications (Endocrine & Metabolic):    methimazole (TAPAZOLE) 10 MG tablet, TAKE 1/2 TABLET BY MOUTH EVERY DAY   Current Outpatient Medications (Respiratory):    chlorpheniramine-HYDROcodone (TUSSIONEX PENNKINETIC ER) 10-8 MG/5ML, Take 5 mLs by mouth every 12  (twelve) hours as needed for cough.   diphenhydrAMINE (BENADRYL) 25 MG tablet, Take 25 mg by mouth daily.   fluticasone-salmeterol (ADVAIR DISKUS) 250-50 MCG/ACT AEPB, Inhale 1 puff into the lungs in the morning and at bedtime.   ipratropium (ATROVENT) 0.06 % nasal spray, SPRAY 2 SPRAYS IN BOTH NOSTRILS IN THE MORNING AND AT BEDTIME.   loratadine (CLARITIN) 10 MG tablet, Take 10 mg by mouth daily.    Current Outpatient Medications (Other):    Cholecalciferol (VITAMIN D3 PO), Take by mouth.   dorzolamide-timolol (COSOPT) 22.3-6.8 MG/ML ophthalmic solution, Place 1 drop into both eyes daily. Through optho   Misc Natural Products (TURMERIC CURCUMIN) CAPS, Take 1 capsule by mouth daily.   Multiple Vitamin (MULTIVITAMIN) tablet, Take 1 tablet by mouth daily.   Omega-3 Fatty Acids (FISH OIL) 500 MG CAPS, Take by mouth. 1200 mg daily   Tart Cherry 1200 MG CAPS,    Reviewed prior external information including notes and imaging from  primary care provider As well as notes that were available from care everywhere and other healthcare systems.  Past medical history, social, surgical and family history all reviewed in electronic medical record.  [name] pertanent information unless stated regarding to the chief complaint.   Review of Systems:  [name] headache, visual changes, nausea, vomiting, diarrhea, constipation, dizziness, abdominal pain, skin rash, fevers, chills, night sweats, weight loss, swollen lymph nodes, body aches, joint swelling, chest pain, shortness of breath, mood changes. POSITIVE muscle aches  Objective  Last menstrual period 03/31/2010.   General: [name] apparent distress alert and oriented x3 mood and affect normal, dressed appropriately.  HEENT: Pupils equal, extraocular movements intact  Respiratory: Patient's speak in full sentences and does not appear short of breath  Cardiovascular: [name] lower extremity edema, non tender, [name] erythema  Hand exam shows the patient still has a trigger  nodule at the A2 pulley of the middle finger. Patient back exam does have some loss lordosis.  Tightness with straight leg test bilaterally.   Procedure: Real-time Ultrasound Guided Injection of the flexor tendon sheath of the middle finger on the right hand Device: GE Logiq Q7 Ultrasound guided injection [name] preferred based studies that show increased duration, increased effect, greater accuracy, decreased procedural pain, increased response rate, and decreased cost with ultrasound guided versus blind injection.  Verbal informed consent obtained.  Time-out conducted.  Noted [name] overlying erythema, induration, or other signs of local infection.  Skin prepped in a sterile fashion.  Local anesthesia: Topical Ethyl chloride.  With sterile technique and under real time ultrasound guidance: With a 25-gauge half inch needle injected with 0.5 cc of 0.5% Marcaine 0.5 cc  of Kenalog 40 mg/mL. Completed without difficulty  Pain immediately resolved suggesting accurate placement of the medication.  Advised to call if fevers/chills, erythema, induration, drainage, or persistent bleeding.  Impression: Technically successful ultrasound guided injection.   Impression and Recommendations:      The above documentation has been reviewed and [name] accurate and complete Judi Saa, DO

## 2021-08-25 ENCOUNTER — Ambulatory Visit: Payer: Managed Care, Other (non HMO) | Admitting: Family Medicine

## 2021-08-25 ENCOUNTER — Encounter: Payer: Self-pay | Admitting: Family Medicine

## 2021-08-25 ENCOUNTER — Ambulatory Visit: Payer: Self-pay

## 2021-08-25 ENCOUNTER — Ambulatory Visit: Payer: Managed Care, Other (non HMO)

## 2021-08-25 VITALS — BP 110/78 | HR 66 | Ht 65.0 in

## 2021-08-25 DIAGNOSIS — M5416 Radiculopathy, lumbar region: Secondary | ICD-10-CM

## 2021-08-25 DIAGNOSIS — M65341 Trigger finger, right ring finger: Secondary | ICD-10-CM | POA: Diagnosis not present

## 2021-08-25 DIAGNOSIS — R079 Chest pain, unspecified: Secondary | ICD-10-CM | POA: Diagnosis not present

## 2021-08-25 DIAGNOSIS — M79642 Pain in left hand: Secondary | ICD-10-CM

## 2021-08-25 DIAGNOSIS — M545 Low back pain, unspecified: Secondary | ICD-10-CM

## 2021-08-25 NOTE — Assessment & Plan Note (Signed)
Low back pain again worsening.  Injection given in September.  Patient was doing significantly better and now worsening pain again.  We will try to repeat the epidural and see if we get as much improvement as we did previously.  Follow-up again 6 to 8 weeks

## 2021-08-25 NOTE — Patient Instructions (Addendum)
Injected trigger finger Epidural [id number] See me in 2-3 months

## 2021-08-25 NOTE — Assessment & Plan Note (Signed)
We have been giving the injection for quite some time.  Discussed again about the possibility of surgical intervention.  The patient does feel well that is long as she gets greater than 6 months of relief she would like to continue with the injections.  Continue splinting otherwise.  Follow-up again in 3 to 4 months

## 2021-08-31 ENCOUNTER — Ambulatory Visit (INDEPENDENT_AMBULATORY_CARE_PROVIDER_SITE_OTHER): Payer: Managed Care, Other (non HMO) | Admitting: Physician Assistant

## 2021-08-31 ENCOUNTER — Encounter: Payer: Self-pay | Admitting: Physician Assistant

## 2021-08-31 VITALS — BP 110/70 | HR 56 | Temp 97.6°F | Ht 65.0 in | Wt 122.5 lb

## 2021-08-31 DIAGNOSIS — R1310 Dysphagia, unspecified: Secondary | ICD-10-CM | POA: Diagnosis not present

## 2021-08-31 DIAGNOSIS — B370 Candidal stomatitis: Secondary | ICD-10-CM | POA: Diagnosis not present

## 2021-08-31 DIAGNOSIS — R053 Chronic cough: Secondary | ICD-10-CM | POA: Diagnosis not present

## 2021-08-31 MED ORDER — NYSTATIN 100000 UNIT/ML MT SUSP: 5.0000 mL | Freq: Four times a day (QID) | OROMUCOSAL | 0 refills | Status: DC

## 2021-08-31 NOTE — Progress Notes (Signed)
Kristine [name] is a 66 y.o. female here for a follow up of a pre-existing problem.   History of Present Illness:   Chief Complaint  Patient presents with   Cough    Pt has a chronic cough, c/o back of throat itchy. She is seeing Pulmonary on Friday.    HPI  Chronic Cough Patient was previously seen on 07/28/2021 for this issue. She was experiencing cough with yellow/green sputum and tickle in her throat. At that time, she was recommended to continue Delsym, inhaler and trial Atrovent nasal spray for her symptoms.   Today, she complain of continued cough which seems to be not improving. Had chest x-ray on 08/25/2021 which was negative. This showed no acute findings. States she has been using Advair inhaler constantly. Reports her symptoms starts to get worse if she stopped using this. She has scheduled appointment with Pulmonologist and seeing them on Friday. Denies fever or chills, SOB.   Thrush She has been experiencing itchy throat. Feels like "woolly". She is good about brushing her teeth and rinsing out her mouth with inhaler use.   Dysphagia She does have hx of worsening cough with foods. Feels like her cough is worse with certain foods. 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 She had smoothies over the weekend and had significant coughing that led to vomiting. She has had about 2 lb weight loss in 1 mo.  Wt Readings from Last 3 Encounters:  08/31/21 122 lb 8 oz (55.6 kg)  07/28/21 124 lb (56.2 kg)  07/19/21 125 lb (56.7 kg)        Past Medical History:  Diagnosis Date   Allergy    GRAVE'S DISEASE 01/24/2007     Social History   Tobacco Use   Smoking status: Never   Smokeless tobacco: Never  Substance Use Topics   Alcohol use: No    Alcohol/week: 0.0 standard drinks of alcohol   Drug use: No    Past Surgical History:  Procedure Laterality Date   CARPAL TUNNEL RELEASE     3-4 yeras ago from 2016   COLONOSCOPY  2010, 2016    polyp in 2010, none 2016   THORACOTOMY  1984   duplicate esophagus   TONSILLECTOMY  1972    Family History  Problem Relation Age of Onset   Heart disease Father        82 MI- 4 stents incl. LAD, former smoker distant   Lung disease Mother        "shock lung"   Colon cancer Neg Hx    Colon polyps Neg Hx    Rectal cancer Neg Hx    Stomach cancer Neg Hx    Esophageal cancer Neg Hx     No Known Allergies  Current Medications:   Current Outpatient Medications:    ADVAIR DISKUS 100-50 MCG/ACT AEPB, Inhale 1 puff into the lungs 2 (two) times daily., Disp: , Rfl:    Cholecalciferol (VITAMIN D3 PO), Take by mouth., Disp: , Rfl:    diphenhydrAMINE (BENADRYL) 25 MG tablet, Take 25 mg by mouth daily., Disp: , Rfl:    dorzolamide-timolol (COSOPT) 22.3-6.8 MG/ML ophthalmic solution, Place 1 drop into both eyes daily. Through optho, Disp: 1 mL, Rfl: 0   ipratropium (ATROVENT) 0.06 % nasal spray, SPRAY 2 SPRAYS IN BOTH NOSTRILS IN THE MORNING AND AT BEDTIME., Disp: 15 mL, Rfl: 1   loratadine (CLARITIN) 10 MG tablet, Take 10 mg by mouth daily., Disp: ,  Rfl:    methimazole (TAPAZOLE) 10 MG tablet, TAKE 1/2 TABLET BY MOUTH EVERY DAY, Disp: 45 tablet, Rfl: 3   Misc Natural Products (TURMERIC CURCUMIN) CAPS, Take 1 capsule by mouth daily., Disp: , Rfl:    Multiple Vitamin (MULTIVITAMIN) tablet, Take 1 tablet by mouth daily., Disp: , Rfl:    Omega-3 Fatty Acids (FISH OIL) 500 MG CAPS, Take by mouth. 1200 mg daily, Disp: , Rfl:    Tart Cherry 1200 MG CAPS, , Disp: , Rfl:    chlorpheniramine-HYDROcodone (TUSSIONEX PENNKINETIC ER) 10-8 MG/5ML, Take 5 mLs by mouth every 12 (twelve) hours as needed for cough. (Patient not taking: Reported on 08/31/2021), Disp: 115 mL, Rfl: 0   Review of Systems:   ROS Negative unless otherwise specified per HPI.   Vitals:   Vitals:   08/31/21 0915  BP: 110/70  Pulse: (!) 56  Temp: 97.6 F (36.4 C)  TempSrc: Temporal  SpO2: 98%  Weight: 122 lb 8 oz (55.6  kg)  Height: 5\' 5"  (1.651 m)     Body mass index is 20.39 kg/m.  Physical Exam:   Physical Exam Vitals and nursing note reviewed.  Constitutional:      General: She is not in acute distress.    Appearance: She is well-developed. She is not ill-appearing or toxic-appearing.  HENT:     Mouth/Throat:     Lips: Pink.     Mouth: Mucous membranes are moist.     Comments: White film at back of tongue and on uvula Cardiovascular:     Rate and Rhythm: Normal rate and regular rhythm.     Pulses: Normal pulses.     Heart sounds: Normal heart sounds, S1 normal and S2 normal.  Pulmonary:     Effort: Pulmonary effort is normal.     Breath sounds: Normal breath sounds.  Skin:    General: Skin is warm and dry.  Neurological:     Mental Status: She is alert.     GCS: GCS eye subscore is 4. GCS verbal subscore is 5. GCS motor subscore is 6.  Psychiatric:        Speech: Speech normal.        Behavior: Behavior normal. Behavior is cooperative.     Assessment and Plan:   Thrush Suspect due to inhaler use She is going to discuss with pulm Start nystatin suspension QID, swish and spit Follow-up after pulm if still ongoing  Dysphagia, unspecified type Strongly recommend referral to GI She declines this presently and would like to wait until she sees pulm on Friday Discussed that she will likely need endoscopy given surgical hx and new sx -- she adamantly refuses GI referral at this time  Chronic cough Stable Xray was negative She is going to see pulm this week Follow-up as needed with Wednesday  I,Savera Zaman,acting as a scribe for [place], PA.,have documented all relevant documentation on the behalf of [employer], PA,as directed by  Jarold Motto, PA while in the presence of Jarold Motto, Jarold Motto.   I, [place], Jarold Motto, have reviewed all documentation for this visit. The documentation on 08/31/21 for the exam, diagnosis, procedures, and orders are all accurate and  complete.   09/02/21, PA-C

## 2021-09-02 ENCOUNTER — Ambulatory Visit
Admission: RE | Admit: 2021-09-02 | Discharge: 2021-09-02 | Disposition: A | Discharge status: Home / Self Care | Payer: Managed Care, Other (non HMO) | Source: Ambulatory Visit | Attending: Family Medicine | Admitting: Family Medicine

## 2021-09-02 DIAGNOSIS — M5416 Radiculopathy, lumbar region: Secondary | ICD-10-CM

## 2021-09-02 MED ORDER — METHYLPREDNISOLONE ACETATE 40 MG/ML INJ SUSP (RADIOLOG: 80.0000 mg | Freq: Once | INTRAMUSCULAR | Status: DC

## 2021-09-02 MED ORDER — IOPAMIDOL (ISOVUE-M 200) INJECTION 41%: 1.0000 mL | Freq: Once | INTRAMUSCULAR | Status: DC

## 2021-09-02 NOTE — Discharge Instructions (Signed)

## 2021-09-03 ENCOUNTER — Encounter: Payer: Self-pay | Admitting: Pulmonary Disease

## 2021-09-03 ENCOUNTER — Ambulatory Visit (INDEPENDENT_AMBULATORY_CARE_PROVIDER_SITE_OTHER): Payer: Managed Care, Other (non HMO) | Admitting: Pulmonary Disease

## 2021-09-03 VITALS — BP 125/65 | HR 56 | Ht 65.0 in | Wt 121.8 lb

## 2021-09-03 DIAGNOSIS — R053 Chronic cough: Secondary | ICD-10-CM

## 2021-09-03 DIAGNOSIS — J309 Allergic rhinitis, unspecified: Secondary | ICD-10-CM | POA: Diagnosis not present

## 2021-09-03 MED ORDER — BUDESONIDE-FORMOTEROL FUMARATE 160-4.5 MCG/ACT IN AERO
2.0000 | INHALATION_SPRAY | Freq: Two times a day (BID) | RESPIRATORY_TRACT | 12 refills | Status: DC
Start: 2021-09-03 — End: 2022-03-14

## 2021-09-03 MED ORDER — SPACER/AERO-HOLDING CHAMBERS DEVI
1.0000 | Freq: Two times a day (BID) | 1 refills | Status: DC
Start: 2021-09-03 — End: 2022-03-14

## 2021-09-03 NOTE — Patient Instructions (Signed)
It is nice to meet you  I think the fact of the cough really reared its head after the flu and then worsened with the bad air quality along with your chest x-ray that shows subtle signs of hyperinflation off it with a diagnosis of asthma.  This is a clinical diagnosis.  There are no great tests to prove this but we could explore additional testing in the future if needed.  Please take Symbicort 2 puffs twice a day every day.  Use with spacer.  Rinse your mouth out with water after every use.  Okay to take Delsym in the morning and at night.  If the cough is improving in the next week or 2 after starting Symbicort, back off to only take it at night and then subsequently you can decrease again in a couple more weeks if symptoms are well controlled.  Given the nasal congestion and allergies, it is possible some of the cough is related to postnasal drip.  Continue the Atrovent nasal spray as well as the saline spray for now.  We can always increase or add to this in the future if needed.  Return to clinic in 2 months or sooner if needed with Dr. Judeth Horn

## 2021-09-03 NOTE — Progress Notes (Signed)
@Patient  ID: , female    DOB: [birth year], 66 y.o.   MRN: [id number]  Chief Complaint  Patient presents with   Consult    Consult: Chronic cough    Referring provider: [id number], PA  HPI:   66 y.o. woman whom are seen in consultation for evaluation of recurrent, chronic cough.  Note from referring provider reviewed.  Most recent PCP note reviewed.  Patient notes onset of cough September 2023.  In October 2023 for anniversary trip.  Unfortunately husband contracted flu earlier in trip.  Quite ill.  She socially got sick toward the end of the trip.  She had flu symptoms and cough.  After flu symptoms improved cough lingered for many weeks.  Gradually improved.  Things are doing okay but then cough returned more recently, June.  Really triggered by work trip to [place].  Notes that air quality was very poor with the smoke blowing down from [place].  Had sensation of near syncope, presyncope while exercising on treadmill.  Was walking a lot in addition to that.  Cough started up in earnest the next day. Concern for nasal congestion, postnasal drip.  Atrovent was added to her saline nasal spray.  This did improve frequency of cough.  Less frequent now.  She stopped this a few days ago and cough significantly worsened.  Now has resumed for about 3 days.  Back to taking Delsym at night but does not count the cough during the night, let her rest.  In addition she is been on Advair.  Initially 100 mcg dose Diskus.  Then increase to 250 Diskus.  Did think it helped some in terms of frequency and severity of cough.  Unfortunately she has a sensation of potential thrush, fussiness referring this in the back of throat.  Most recent chest x-ray 08/25/2020 reviewed and on my interpretation reveals clear lungs with signs of hyperinflation.  PMH: Seasonal allergies, hypothyroidism Surgical history: Carpal tunnel surgery, thoracotomy for duplicated esophagus, tonsillectomy Family history:  Father with CAD, mother with severe lung disease in the setting of severe tobacco abuse Social history: Never smoker, lives in [place] / Pulmonary Flowsheets:   ACT:      No data to display          MMRC:     No data to display          Epworth:      No data to display          Tests:   FENO:  No results found for: "NITRICOXIDE"  PFT:     No data to display          WALK:      No data to display          Imaging: Personally reviewed and as per EMR discussion in this note DG INJECT DIAG/THERA/INC NEEDLE/CATH/PLC EPI/LUMB/SAC W/IMG  Result Date: 09/02/2021 CLINICAL DATA:  Lumbosacral spondylosis without myelopathy. Good response to the prior epidural injection in 10/2020. Recurrent right-sided low back, hip, and thigh pain. FLUOROSCOPY: Radiation Exposure Index (as provided by the fluoroscopic device): 0.60 mGy Kerma PROCEDURE: The procedure, risks, benefits, and alternatives were explained to the patient. Questions regarding the procedure were encouraged and answered. The patient understands and consents to the procedure. LUMBAR EPIDURAL INJECTION: An interlaminar approach was performed on the right at L4-5. The overlying skin was cleansed and anesthetized. A 3.5 inch 20 gauge epidural needle was advanced using loss-of-resistance technique. DIAGNOSTIC EPIDURAL INJECTION: Injection  of Isovue-M 200 shows a good epidural pattern with spread above and below the level of needle placement, primarily on the right. No vascular opacification is seen. THERAPEUTIC EPIDURAL INJECTION: 80 mg of Depo-Medrol mixed with 3 mL of 1% lidocaine were instilled. The procedure was well-tolerated, and the patient was discharged thirty minutes following the injection in good condition. COMPLICATIONS: None immediate IMPRESSION: Technically successful interlaminar epidural injection on the right at L4-5. Electronically Signed   By: Sebastian Ache [name]   On: 09/02/2021  15:00   [place] LIMITED JOINT SPACE STRUCTURES UP LEFT(NO LINKED CHARGES)  Result Date: 08/27/2021 Procedure: Real-time Ultrasound Guided Injection of the flexor tendon sheath of the middle finger on the right hand Device: GE Logiq Q7 Ultrasound guided injection is preferred based studies that show increased duration, increased effect, greater accuracy, decreased procedural pain, increased response rate, and decreased cost with ultrasound guided versus blind injection. Verbal informed consent obtained. Time-out conducted. Noted no overlying erythema, induration, or other signs of local infection. Skin prepped in a sterile fashion. Local anesthesia: Topical Ethyl chloride. With sterile technique and under real time ultrasound guidance: With a 25-gauge half inch needle injected with 0.5 cc of 0.5% Marcaine 0.5 cc of Kenalog 40 mg/mL. Completed without difficulty Pain immediately resolved suggesting accurate placement of the medication. Advised to call if fevers/chills, erythema, induration, drainage, or persistent bleeding. Impression: Technically successful ultrasound guided injection.   DG Chest 2 View  Result Date: 08/25/2021 CLINICAL DATA:  Recurrent intermittent productive dry cough for 6 weeks EXAM: CHEST - 2 VIEW COMPARISON:  12/01/2020 FINDINGS: Frontal and lateral views of the chest demonstrate an unremarkable cardiac silhouette. No acute airspace disease, effusion, or pneumothorax. No acute bony abnormalities. IMPRESSION: 1. Stable chest, no acute process. Electronically Signed   By: Sharlet Salina [name]   On: 08/25/2021 23:39    Lab Results: Personally reviewed CBC    Component Value Date/Time   WBC 6.0 03/08/2021 1622   RBC 4.58 03/08/2021 1622   HGB 14.5 03/08/2021 1622   HCT 43.1 03/08/2021 1622   PLT 231.0 03/08/2021 1622   MCV 94.3 03/08/2021 1622   MCH 33.9 08/25/2009 1346   MCHC 33.5 03/08/2021 1622   RDW 12.8 03/08/2021 1622   LYMPHSABS 2.4 03/08/2021 1622   MONOABS 0.4 03/08/2021  1622   EOSABS 0.1 03/08/2021 1622   BASOSABS 0.1 03/08/2021 1622    BMET    Component Value Date/Time   NA 140 03/08/2021 1622   K 3.9 03/08/2021 1622   CL 105 03/08/2021 1622   CO2 30 03/08/2021 1622   GLUCOSE 79 03/08/2021 1622   GLUCOSE 92 01/13/2006 0933   BUN 9 03/08/2021 1622   CREATININE 0.72 03/08/2021 1622   CALCIUM 9.6 03/08/2021 1622   GFRNONAA 113 01/13/2006 0933    BNP No results found for: "BNP"  ProBNP No results found for: "PROBNP"  Specialty Problems       Pulmonary Problems   Allergic rhinitis    claritin or allegra in AM, Benadryl prn night       No Known Allergies  Immunization History  Administered Date(s) Administered   Influenza Whole 02/12/2009, 10/13/2009   Influenza,inj,Quad PF,6+ Mos 01/30/2013, 12/26/2013, 10/29/2018   Influenza-Unspecified 03/26/2011, 12/06/2015, 02/04/2017, 11/04/2017, 11/15/2019, 10/23/2020   PFIZER Comirnaty(Gray Top)Covid-19 Tri-Sucrose Vaccine 07/18/2020   PFIZER(Purple Top)SARS-COV-2 Vaccination 04/29/2019, 05/27/2019, 11/30/2019   Pfizer Covid-19 Vaccine Bivalent Booster 43yrs & up 10/23/2020   Td 09/09/2009   Tdap 02/09/2016   Zoster Recombinat (Shingrix) 02/26/2018,  05/01/2018    Past Medical History:  Diagnosis Date   Allergy    GRAVE'S DISEASE 01/24/2007    Tobacco History: Social History   Tobacco Use  Smoking Status Never  Smokeless Tobacco Never   Counseling given: Not Answered   Continue to not smoke  Outpatient Encounter Medications as of 09/03/2021  Medication Sig   ADVAIR DISKUS 100-50 MCG/ACT AEPB Inhale 1 puff into the lungs 2 (two) times daily.   budesonide-formoterol (SYMBICORT) 160-4.5 MCG/ACT inhaler Inhale 2 puffs into the lungs 2 (two) times daily.   diphenhydrAMINE (BENADRYL) 25 MG tablet Take 25 mg by mouth daily.   ipratropium (ATROVENT) 0.06 % nasal spray SPRAY 2 SPRAYS IN BOTH NOSTRILS IN THE MORNING AND AT BEDTIME.   methimazole (TAPAZOLE) 10 MG tablet TAKE 1/2  TABLET BY MOUTH EVERY DAY   Spacer/Aero-Holding Chambers DEVI 1 Inhalation by Does not apply route 2 (two) times daily.   chlorpheniramine-HYDROcodone (TUSSIONEX PENNKINETIC ER) 10-8 MG/5ML Take 5 mLs by mouth every 12 (twelve) hours as needed for cough. (Patient not taking: Reported on 08/31/2021)   Cholecalciferol (VITAMIN D3 PO) Take by mouth.   dorzolamide-timolol (COSOPT) 22.3-6.8 MG/ML ophthalmic solution Place 1 drop into both eyes daily. Through optho   loratadine (CLARITIN) 10 MG tablet Take 10 mg by mouth daily.   Misc Natural Products (TURMERIC CURCUMIN) CAPS Take 1 capsule by mouth daily.   Multiple Vitamin (MULTIVITAMIN) tablet Take 1 tablet by mouth daily.   nystatin (MYCOSTATIN) 100000 UNIT/ML suspension Take 5 mLs (500,000 Units total) by mouth 4 (four) times daily. Swish and spit   Omega-3 Fatty Acids (FISH OIL) 500 MG CAPS Take by mouth. 1200 mg daily   Tart Cherry 1200 MG CAPS    No facility-administered encounter medications on file as of 09/03/2021.     Review of Systems  Review of Systems  No chest pain exertion.  No orthopnea or PND.  Comprehensive review of systems otherwise negative. Physical Exam  BP 125/65 (BP Location: Left Arm)   Pulse (!) 56   Ht 5\' 5"  (1.651 m)   Wt 121 lb 12.8 oz (55.2 kg)   LMP 03/31/2010   SpO2 98%   BMI 20.27 kg/m   Wt Readings from Last 5 Encounters:  09/03/21 121 lb 12.8 oz (55.2 kg)  08/31/21 122 lb 8 oz (55.6 kg)  07/28/21 124 lb (56.2 kg)  07/19/21 125 lb (56.7 kg)  05/18/21 123 lb (55.8 kg)    BMI Readings from Last 5 Encounters:  09/03/21 20.27 kg/m  08/31/21 20.39 kg/m  08/25/21 20.63 kg/m  07/28/21 20.63 kg/m  07/19/21 20.80 kg/m     Physical Exam General: Well-appearing, in no acute distress Eyes: EOMI, no icterus Neck: Supple, no JVP Pulmonary: Clear, normal work of breathing Cardiovascular: Warm, no edema Abdomen: Nondistended, soft present MSK: No synovitis, joint effusion Neuro: Normal gait,  no weakness Psych: Normal mood, full affect   Assessment & Plan:   Chronic, recurrent cough: Sounds like really began in the fall 2022 after flu infection.  Cough lasted weeks and gradually improved.  Then recurred again in June when out and about in the [place], [place], with bad air quality.  Lingering now.  Improved with nasal regimen.  Associated seasonal allergies so postnasal drip likely contributing.  To continue this for now.  Also high suspicion for asthma given mild improvement with Advair, hyperinflation on chest x-ray, and atopic history.  Cough variant asthma: With hyperinflation on chest x-ray and atopic symptoms.  Stop  Advair given thrush, was mid dose.  Suspect could be in the setting of DPI.  Will prescribe high-dose Symbicort via HFA with spacer to help deliver medicine appropriately and hopefully avoid thrush.  Importance of rinsing mouth with water after each use was explained.   Return in about 2 months (around 11/04/2021).   Karren Burly, MD 09/03/2021

## 2021-09-17 ENCOUNTER — Encounter: Payer: Self-pay | Admitting: Pulmonary Disease

## 2021-09-17 MED ORDER — FLUTICASONE PROPIONATE 50 MCG/ACT NA SUSP
1.0000 | Freq: Two times a day (BID) | NASAL | 2 refills | Status: DC
Start: 2021-09-17 — End: 2022-04-15

## 2021-09-17 NOTE — Telephone Encounter (Signed)
Mychart message sent by pt: Kristine [name]  P [facility] (supporting Karren Burly, MD) 55 minutes ago (8:28 AM)   EM Hi Dr. Judeth Horn!   It'Kristine been two weeks since my appointment--two weeks taking Symbicort (along with Delsym and Atrovent).    My cough has improved slightly (I'm having significantly fewer uncontrollable coughing jags), but I still can't go without taking Delsym twice a day. I actually had to take codeine cough syrup on Wednesday night (first time in three weeks).   At the same time, the volume of my post-nasal drip seems to be increasing.   Exercise does not exacerbate my cough.   Any suggestions?   Dr. Judeth Horn, please advise.

## 2021-10-25 ENCOUNTER — Other Ambulatory Visit: Payer: Self-pay | Admitting: Physician Assistant

## 2021-10-27 NOTE — Progress Notes (Unsigned)
[name] [address] [place] [address] Phone: [phone] Subjective:   Kristine [name], am serving as a scribe for Dr. Antoine Primas.  I'm seeing this patient by the request  of:  Kristine Majestic, MD  CC: back pain and finger follo wup   JJO:[id number]  08/25/2021 Low back pain again worsening.  Injection given in September.  Patient was doing significantly better and now worsening pain again.  We will try to repeat the epidural and see if we get as much improvement as we did previously.  Follow-up again 6 to 8 weeks  We have been giving the injection for quite some time.  Discussed again about the possibility of surgical intervention.  The patient does feel well that is long as she gets greater than 6 months of relief she would like to continue with the injections.  Continue splinting otherwise.  Follow-up again in 3 to 4 months  Updated 10/28/2021 Kristine [name] is a 66 y.o. female coming in with complaint of back pain and trigger finger. Epi on 09/02/2021. Patient states she was able to ride 65 miles on her bike without pain in hands or bike.   Patient has been running on treadmill that she will have pain over medial aspect of her R knee. No pain on the bike. Has tried ice.          Past Medical History:  Diagnosis Date   Allergy    GRAVE'S DISEASE 01/24/2007   Past Surgical History:  Procedure Laterality Date   CARPAL TUNNEL RELEASE     3-4 yeras ago from 2016   COLONOSCOPY  2010, 2016   polyp in 2010, none 2016   THORACOTOMY  1984   duplicate esophagus   TONSILLECTOMY  1972   Social History   Socioeconomic History   Marital status: Married    Spouse name: Not on file   Number of children: Not on file   Years of education: Not on file   Highest education level: Not on file  Occupational History   Not on file  Tobacco Use   Smoking status: Never   Smokeless tobacco: Never  Substance and Sexual Activity   Alcohol  use: No    Alcohol/week: 0.0 standard drinks of alcohol   Drug use: No   Sexual activity: Not on file  Other Topics Concern   Not on file  Social History Narrative   Married. 86 (grandson 3441) and 48 year old daughters (grandson in late 2020) in 2016. Both live close.    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 LAD, former smoker distant   Lung disease Mother        "shock lung"   Colon cancer Neg Hx    Colon polyps Neg Hx    Rectal cancer Neg Hx    Stomach cancer Neg Hx    Esophageal cancer Neg Hx  Current Outpatient Medications (Endocrine & Metabolic):    methimazole (TAPAZOLE) 10 MG tablet, TAKE 1/2 TABLET BY MOUTH EVERY DAY   Current Outpatient Medications (Respiratory):    budesonide-formoterol (SYMBICORT) 160-4.5 MCG/ACT inhaler, Inhale 2 puffs into the lungs 2 (two) times daily.   chlorpheniramine-HYDROcodone (TUSSIONEX PENNKINETIC ER) 10-8 MG/5ML, Take 5 mLs by mouth every 12 (twelve) hours as needed for cough.   diphenhydrAMINE (BENADRYL) 25 MG tablet, Take 25 mg by mouth daily.   fluticasone (FLONASE) 50 MCG/ACT nasal spray, Place 1 spray into both nostrils 2 (two) times daily.   ipratropium (ATROVENT) 0.06 % nasal spray, SPRAY 2 SPRAYS IN BOTH NOSTRILS IN THE MORNING AND AT BEDTIME.   loratadine (CLARITIN) 10 MG tablet, Take 10 mg by mouth daily.    Current Outpatient Medications (Other):    Cholecalciferol (VITAMIN D3 PO), Take by mouth.   dorzolamide-timolol (COSOPT) 22.3-6.8 MG/ML  ophthalmic solution, Place 1 drop into both eyes daily. Through optho   Misc Natural Products (TURMERIC CURCUMIN) CAPS, Take 1 capsule by mouth daily.   Multiple Vitamin (MULTIVITAMIN) tablet, Take 1 tablet by mouth daily.   nystatin (MYCOSTATIN) 100000 UNIT/ML suspension, Take 5 mLs (500,000 Units total) by mouth 4 (four) times daily. Swish and spit   Omega-3 Fatty Acids (FISH OIL) 500 MG CAPS, Take by mouth. 1200 mg daily   Spacer/Aero-Holding Chambers DEVI, 1 Inhalation by Does not apply route 2 (two) times daily.   Tart Cherry 1200 MG CAPS,      Review of Systems:  No headache, visual changes, nausea, vomiting, diarrhea, constipation, dizziness, abdominal pain, skin rash, fevers, chills, night sweats, weight loss, swollen lymph nodes, body aches, joint swelling, chest pain, shortness of breath, mood changes. POSITIVE muscle aches  Objective  Blood pressure 124/80, height 5\' 5"  (1.651 m), weight 125 lb (56.7 kg), last menstrual period 03/31/2010.   General: No apparent distress alert and oriented x3 mood and affect normal, dressed appropriately.  HEENT: Pupils equal, extraocular movements intact  Respiratory: Patient's speak in full sentences and does not appear short of breath  Cardiovascular: No lower extremity edema, non tender, no erythema  Patient back exam shows that patient is having no significant discomfort at the moment.  Able to sit comfortably today.  Right knee does have a fullness noted more on the medial aspect.  Tender to palpation.  Negative McMurray's though noted.   Limited muscular skeletal ultrasound was performed and interpreted by Hulan Saas, M  Patient does on the knee have a hypoechoic changes noted of the medial posterior popliteal area that is consistent with a cyst.  This does seem to be ruptured with some drainage going down the calf as well as to the Pez anserine area.  Meniscus appear to be unremarkable.  No significant swelling in the patellofemoral  joint Impression: Ruptured Baker's cyst   Impression and Recommendations:    The above documentation has been reviewed and is accurate and complete Lyndal Pulley, DO

## 2021-10-28 ENCOUNTER — Ambulatory Visit: Payer: Managed Care, Other (non HMO) | Admitting: Family Medicine

## 2021-10-28 ENCOUNTER — Ambulatory Visit: Payer: Self-pay

## 2021-10-28 ENCOUNTER — Encounter: Payer: Self-pay | Admitting: Family Medicine

## 2021-10-28 VITALS — BP 124/80 | Ht 65.0 in | Wt 125.0 lb

## 2021-10-28 DIAGNOSIS — M660 Rupture of popliteal cyst: Secondary | ICD-10-CM | POA: Diagnosis not present

## 2021-10-28 DIAGNOSIS — M79642 Pain in left hand: Secondary | ICD-10-CM | POA: Diagnosis not present

## 2021-10-28 NOTE — Patient Instructions (Signed)
Ruptured Baker's Cyst Body Helix-compression with running If it reaccumlates give [place] a call See me in 3 months

## 2021-10-28 NOTE — Assessment & Plan Note (Signed)
Right sided. Noted some swelling going down the posterior calf as well as along the hamstring and even near the pes anserine.  Hamstring does appear to be intact with full strength noted today.  Discussed with patient though that this means that patient did have some swelling in her knee at some point.  Discussed with patient about icing regimen, home exercises, we discussed compression.  Discussed if for any reaccumulation can consider aspiration to avoid that this ever happens again.  Follow-up again in 6 to 8 weeks

## 2021-11-01 ENCOUNTER — Encounter: Payer: Self-pay | Admitting: *Deleted

## 2021-11-04 ENCOUNTER — Ambulatory Visit: Payer: Managed Care, Other (non HMO) | Admitting: Pulmonary Disease

## 2021-11-18 ENCOUNTER — Other Ambulatory Visit: Payer: Self-pay | Admitting: Physician Assistant

## 2021-11-18 MED ORDER — IPRATROPIUM BROMIDE 0.06 % NA SOLN: 1.0000 | Freq: Every day | NASAL | 3 refills | Status: DC

## 2022-01-18 NOTE — Progress Notes (Signed)
[name] [address] [place] [address] Phone: [phone] Subjective:    I'm seeing this patient by the request  of:  [name], MD  CC: Multiple complaint follow-up  IRS:[id number]  10/28/2021 Right sided. Noted some swelling going down the posterior calf as well as along the hamstring and even near the pes anserine.  Hamstring does appear to be intact with full strength noted today.  Discussed with patient though that this means that patient did have some swelling in her knee at some point.  Discussed with patient about icing regimen, home exercises, we discussed compression.  Discussed if for any reaccumulation can consider aspiration to avoid that this ever happens again.  Follow-up again in 6 to 8 weeks   Update 01/20/2022 Kristine [name] is a 65 y.o. female coming in with complaint of R knee, LBP and L elbow/wrist pain. Patient states that likes the body helix. Able to ride without brace but uses brace for running. L elbow pain is worse due to lifting her grandkids.   Also c/o cyst in palmar aspect of R hand near MCP jt of middle and ring finger.   Uses Voltaren gel in R CMC joint. Feels like pain is worse.        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 72 (grandson 5562) and 52 year old daughters (grandson in late 2020) in 2016. Both live close.    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 LAD, former smoker distant   Lung disease Mother        "shock lung"   Colon cancer Neg Hx    Colon polyps Neg Hx    Rectal cancer Neg Hx    Stomach cancer Neg Hx    Esophageal cancer Neg Hx     Current Outpatient Medications (Endocrine & Metabolic):    methimazole (TAPAZOLE) 10 MG tablet, TAKE 1/2  TABLET BY MOUTH EVERY DAY   Current Outpatient Medications (Respiratory):    diphenhydrAMINE (BENADRYL) 25 MG tablet, Take 25 mg by mouth daily.   fluticasone (FLONASE) 50 MCG/ACT nasal spray, Place 1 spray into both nostrils 2 (two) times daily.   ipratropium (ATROVENT) 0.06 % nasal spray, Place 1 spray into both nostrils daily.   loratadine (CLARITIN) 10 MG tablet, Take 10 mg by mouth daily.   budesonide-formoterol (SYMBICORT) 160-4.5 MCG/ACT inhaler, Inhale 2 puffs into the lungs 2 (two) times daily.   chlorpheniramine-HYDROcodone (TUSSIONEX PENNKINETIC ER) 10-8 MG/5ML, Take 5 mLs by mouth every 12 (twelve) hours as needed for cough.    Current Outpatient Medications (Other):    Cholecalciferol (VITAMIN D3 PO), Take by mouth.   dorzolamide-timolol (COSOPT) 22.3-6.8 MG/ML ophthalmic solution, Place 1 drop into both eyes daily. Through optho   Misc Natural  Products (TURMERIC CURCUMIN) CAPS, Take 1 capsule by mouth daily.   Multiple Vitamin (MULTIVITAMIN) tablet, Take 1 tablet by mouth daily.   Omega-3 Fatty Acids (FISH OIL) 500 MG CAPS, Take by mouth. 1200 mg daily   Tart Cherry 1200 MG CAPS,    nystatin (MYCOSTATIN) 100000 UNIT/ML suspension, Take 5 mLs (500,000 Units total) by mouth 4 (four) times daily. Swish and spit   Spacer/Aero-Holding Rudean Curt, 1 Inhalation by Does not apply route 2 (two) times daily.   Reviewed prior external information including notes and imaging from  primary care provider As well as notes that were available from care everywhere and other healthcare systems.  Past medical history, social, surgical and family history all reviewed in electronic medical record.  No pertanent information unless stated regarding to the chief complaint.   Review of Systems:  No headache, visual changes, nausea, vomiting, diarrhea, constipation, dizziness, abdominal pain, skin rash, fevers, chills, night sweats, weight loss, swollen lymph nodes, body aches, joint swelling, chest pain, shortness of breath, mood changes. POSITIVE muscle aches  Objective  Blood pressure 102/72, pulse 74, height 5\' 5"  (1.651 m), weight 124 lb (56.2 kg), last menstrual period 03/31/2010, SpO2 98 %.   General: No apparent distress alert and oriented x3 mood and affect normal, dressed appropriately.  HEENT: Pupils equal, extraocular movements intact  Respiratory: Patient's speak in full sentences and does not appear short of breath  Cardiovascular: No lower extremity edema, non tender, no erythema  Hand shows significant tenderness noted just proximal to the A2 pulley of the middle finger.  Mild cyst formation noted.  Patient does have good range of motion at the moment.  The [facility] joint on the right side does have the arthritic changes but negative grind test today.  Elbow does have mild positive Tinel's but worsening pain actually with extension of the  wrist.  Procedure: Real-time Ultrasound Guided Injection of left hand cyst in the tendon sheath of the third finger Device: GE Logiq Q7 Ultrasound guided injection is preferred based studies that show increased duration, increased effect, greater accuracy, decreased procedural pain, increased response rate, and decreased cost with ultrasound guided versus blind injection.  Verbal informed consent obtained.  Time-out conducted.  Noted no overlying erythema, induration, or other signs of local infection.  Skin prepped in a sterile fashion.  Local anesthesia: Topical Ethyl chloride.  With sterile technique and under real time ultrasound guidance: With a 25-gauge half inch needle injected with 0.5 cc of 0.5% Marcaine and 0.5 cc of Kenalog 40 mg/mL Completed without difficulty  Pain immediately resolved suggesting accurate placement of the medication.  Advised to call  if fevers/chills, erythema, induration, drainage, or persistent bleeding.  Impression: Technically successful ultrasound guided injection.   Impression and Recommendations:    The above documentation has been reviewed and is accurate and complete Judi Saa, DO

## 2022-01-20 ENCOUNTER — Encounter: Payer: Self-pay | Admitting: Family Medicine

## 2022-01-20 ENCOUNTER — Ambulatory Visit: Payer: Managed Care, Other (non HMO) | Admitting: Family Medicine

## 2022-01-20 ENCOUNTER — Ambulatory Visit: Payer: Self-pay

## 2022-01-20 VITALS — BP 102/72 | HR 74 | Ht 65.0 in | Wt 124.0 lb

## 2022-01-20 DIAGNOSIS — M25522 Pain in left elbow: Secondary | ICD-10-CM | POA: Diagnosis not present

## 2022-01-20 DIAGNOSIS — G5622 Lesion of ulnar nerve, left upper limb: Secondary | ICD-10-CM

## 2022-01-20 DIAGNOSIS — M67442 Ganglion, left hand: Secondary | ICD-10-CM

## 2022-01-20 NOTE — Patient Instructions (Signed)
No over hand lifting  Change typing position See me in 3 months

## 2022-01-21 ENCOUNTER — Encounter: Payer: Self-pay | Admitting: Family Medicine

## 2022-01-21 NOTE — Assessment & Plan Note (Signed)
Repeat injection given again today.  Tolerated the procedure extremely well.  Very hopeful that this will make a significant improvement.  Hopefully will get at least 6 months out of it.  Otherwise we have discussed advanced imaging and surgical intervention if this continues to come back.  Patient will follow-up with me again in 3 months.

## 2022-01-21 NOTE — Assessment & Plan Note (Signed)
Continues to have difficulty with this as well as more of a lateral epicondylitis.  Discussed with patient about no overhead lifting.  Discussed avoiding repetitive extension of the wrist if possible.  We discussed ergonomics when working on the computer.

## 2022-03-14 ENCOUNTER — Ambulatory Visit (INDEPENDENT_AMBULATORY_CARE_PROVIDER_SITE_OTHER): Payer: Managed Care, Other (non HMO) | Admitting: Family Medicine

## 2022-03-14 ENCOUNTER — Encounter: Payer: Self-pay | Admitting: Family Medicine

## 2022-03-14 VITALS — BP 110/70 | HR 66 | Temp 97.4°F | Ht 65.0 in | Wt 122.8 lb

## 2022-03-14 DIAGNOSIS — E785 Hyperlipidemia, unspecified: Secondary | ICD-10-CM

## 2022-03-14 DIAGNOSIS — E0500 Thyrotoxicosis with diffuse goiter without thyrotoxic crisis or storm: Secondary | ICD-10-CM

## 2022-03-14 DIAGNOSIS — Z0000 Encounter for general adult medical examination without abnormal findings: Secondary | ICD-10-CM | POA: Diagnosis not present

## 2022-03-14 NOTE — Patient Instructions (Addendum)
Sign release of information at the check out desk for mammogram from [facility] follow up   Please stop by lab before you go If you have mychart- we will send your results within 3 business days of [place] receiving them.  If you do not have mychart- we will call you about results within 5 business days of [place] receiving them.  *please also note that you will see labs on mychart as soon as they post. I will later go in and write notes on them- will say "notes from Dr. Yong Channel"   Recommended follow up: Return in about 1 year (around 03/15/2023) for physical or sooner if needed.Schedule b4 you leave.

## 2022-03-14 NOTE — Progress Notes (Signed)
Phone [phone]   Subjective:  Patient presents today for their annual physical. Chief complaint-noted.   See problem oriented charting- ROS- full  review of systems was completed and negative except for: post nasal drip, eye redness with sleeping poorly with upcoming gala, stress with upcoming fundraising gala, cold intolerance  The following were reviewed and entered/updated in epic: Past Medical History:  Diagnosis Date   Allergy    GRAVE'S DISEASE 01/24/2007   Patient Active Problem List   Diagnosis Date Noted   Graves disease 01/24/2007    Priority: Medium    Eczema 08/26/2014    Priority: Low   Allergic rhinitis 08/26/2014    Priority: Low   History of colonic polyps 04/23/2010    Priority: Low   Ruptured Bakers cyst 10/28/2021   Ulnar nerve entrapment at elbow, 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 LAD, former smoker distant   Lung disease Mother        "shock lung"   Colon cancer Neg Hx    Colon polyps Neg Hx    Rectal cancer Neg Hx    Stomach cancer Neg Hx    Esophageal cancer Neg Hx     Medications- reviewed and updated Current Outpatient Medications  Medication Sig Dispense Refill   Cholecalciferol (VITAMIN D3 PO) Take by mouth.     diphenhydrAMINE (BENADRYL) 25 MG tablet Take 25 mg by mouth daily.     dorzolamide-timolol (COSOPT) 22.3-6.8 MG/ML ophthalmic solution Place 1 drop into both eyes daily.  Through optho 1 mL 0   fluticasone (FLONASE) 50 MCG/ACT nasal spray Place 1 spray into both nostrils 2 (two) times daily. 16 g 2   ipratropium (ATROVENT) 0.06 % nasal spray Place 1 spray into both nostrils daily. 15 mL 3   loratadine (CLARITIN) 10 MG tablet Take 10 mg by mouth daily.     methimazole (TAPAZOLE) 10 MG tablet TAKE 1/2 TABLET BY MOUTH EVERY DAY 45 tablet 3   Misc Natural Products (TURMERIC CURCUMIN) CAPS Take 1 capsule by mouth daily.     Multiple Vitamin (MULTIVITAMIN) tablet Take 1 tablet by mouth daily.     Omega-3 Fatty Acids (FISH OIL) 500 MG CAPS Take by mouth. 1200 mg daily     Tart Cherry 1200 MG CAPS      No current facility-administered medications for this visit.    Allergies-reviewed and updated No Known Allergies  Social History   Social History Narrative   Married. 65 (grandson 1714) and 31 year old daughters (grandson in late 2020) in 2016. Both live close.    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 Oropharynx normal Neck: mild thyromegaly CV: RRR no murmurs rubs or gallops Lungs: CTAB no crackles, wheeze, rhonchi Abdomen: soft/nontender/nondistended/normal bowel sounds. No rebound or guarding.  Ext: no edema Skin: warm, dry Neuro: grossly normal, moves all extremities, PERRLA   Assessment and Plan   67 y.o. female presenting for annual physical.  Health Maintenance counseling: 1. Anticipatory guidance: Patient counseled regarding regular dental exams -q6 months, eye exams - yearly,  avoiding smoking and second hand smoke , limiting alcohol to 1 beverage per day- does not drink , no illicit  drugs .   2. Risk factor reduction:  Advised patient of need for regular exercise and diet rich and fruits and vegetables to reduce risk of heart attack and stroke.  Exercise- great job on regular exercise still.  Diet/weight management-maintains healthy diet.  Wt Readings from Last 3 Encounters:  03/14/22 122 lb 12.8 oz (55.7 kg)  01/20/22 124 lb (56.2 kg)  10/28/21 125 lb (56.7 kg)   3. Immunizations/screenings/ancillary studies Immunization History  Administered Date(s) Administered   Influenza Whole 02/12/2009, 10/13/2009   Influenza,inj,Quad PF,6+ Mos 01/30/2013, 12/26/2013, 10/29/2018   Influenza-Unspecified 03/26/2011, 12/06/2015, 02/04/2017, 11/04/2017, 11/15/2019, 10/23/2020, 11/05/2021   PFIZER Comirnaty(Gray Top)Covid-19 Tri-Sucrose Vaccine 07/18/2020, 11/05/2021   PFIZER(Purple Top)SARS-COV-2 Vaccination 04/29/2019, 05/27/2019, 11/30/2019   Pfizer Covid-19 Vaccine Bivalent Booster 58yrs & up 10/23/2020   Rsv, Mab, Nirsevimab-alip, 1 Ml, Neonate To 24 Mos(Beyfortus) 12/27/2021   Td 09/09/2009   Tdap 02/09/2016   Zoster Recombinat (Shingrix) 02/26/2018, 05/01/2018  4. Cervical cancer screening- pap smear 1//7/22 with 3 year repeat planned  5. Breast cancer screening-  breast exam with GYN  and mammogram  10/23/20 with 1 year repeat panned- had in 2023- we will get records 6. Colon cancer screening - colonoscopy 02/09/16 with 10 year repeat planned  7. Skin cancer screening-has seen Dr. Renda Rolls in the past-not recently- plans to schedule. advised regular sunscreen use. Denies worrisome, changing, or new skin lesions.  8. Birth control/STD check- postmenopausal/monogamous 9. Osteoporosis screening at 71- DEXA  02/23/2016 that is normal -Never smoker-   Status of chronic or acute concerns   #graves disease- on long term methimazole 5 mg and does not want more intervention than this. Asymptomatic  other than very cold natured -aware of if febrile need to know asap due to  agranulocytosis risk. Almost never gets fevers thankfully Lab Results  Component Value Date   TSH 1.21 03/08/2021   #hyperlipidemia S: Medication: fish oil  The 10-year ASCVD risk score (Arnett DK, et al., 2019) is: 3.3%  Lab Results  Component Value Date   CHOL 190 03/08/2021   HDL 66.00 03/08/2021   LDLCALC 111 (H) 03/08/2021   TRIG 67.0 03/08/2021   CHOLHDL 3 03/08/2021   A/P: very mild elevation but not in range for medicine unless worsens- update today  #Left elbow pain- working with Dr. Tamala Julian ulnar entrapment at elbow and he also last gave injection into ganglion cyst. She reports  doing better lately- trigger finger is doing better in 15 months, ganglion cyst has not recurred since December, elbow is better too.  -back and hip issues- has to limit running to once a week- intervals and slower   #cough variant asthma?  - main issue  chronic cough that thankfully resolved (she feels that post nasal drip is main driver) noted per Dr. Silas Flood on 09/03/21 with plan for 2 month follow up after starting symbicort- she took for a week with no improvement. She has tried ipratropium wean but failed this- other than did get down to once a day - felt like the most helpful thing from that visit was consistent flonase that he prescribed. She is also on ipratropium. He is no longer with [facility].  -also on claritin and needs benadryl at night- still mild post nasal drip and cough with this but drastically better. Also reassuring cxr 08/25/21  Recommended follow up: Return in about 1 year (around 03/15/2023) for physical or sooner if needed.Schedule b4 you leave. Future Appointments  Date Time Provider [place]  04/21/2022  4:00 PM Lyndal Pulley, DO [facility] None   Lab/Order associations: fasting- half banana at 7 30    ICD-10-CM   1. Preventative health care  Z00.00 TSH    T4, free    T3, free    CBC with Differential/Platelet    Comprehensive metabolic panel    Lipid panel    2.  Graves disease  E05.00 TSH    T4, free    T3, free    3. Mild hyperlipidemia  E78.5 CBC with Differential/Platelet    Comprehensive metabolic panel    Lipid panel      No orders of the defined types were placed in this encounter.   Return precautions advised.  Garret Reddish, MD

## 2022-03-15 LAB — LIPID PANEL
Cholesterol: 218 mg/dL — ABNORMAL HIGH (ref 0–200)
HDL: 65.2 mg/dL (ref >39.00)
LDL Cholesterol: 140 mg/dL — ABNORMAL HIGH (ref 0–99)
NonHDL: 152.58
Total CHOL/HDL Ratio: 3
Triglycerides: 62 mg/dL (ref 0.0–149.0)
VLDL: 12.4 mg/dL (ref 0.0–40.0)

## 2022-03-15 LAB — COMPREHENSIVE METABOLIC PANEL
ALT: 17 U/L (ref 0–35)
AST: 28 U/L (ref 0–37)
Albumin: 4.7 g/dL (ref 3.5–5.2)
Alkaline Phosphatase: 65 U/L (ref 39–117)
BUN: 11 mg/dL (ref 6–23)
CO2: 24 mEq/L (ref 19–32)
Calcium: 10 mg/dL (ref 8.4–10.5)
Chloride: 107 mEq/L (ref 96–112)
Creatinine, Ser: 0.77 mg/dL (ref 0.40–1.20)
GFR: 81.65 mL/min (ref >60.00)
Glucose, Bld: 80 mg/dL (ref 70–99)
Potassium: 4.6 mEq/L (ref 3.5–5.1)
Sodium: 143 mEq/L (ref 135–145)
Total Bilirubin: 0.9 mg/dL (ref 0.2–1.2)
Total Protein: 7.1 g/dL (ref 6.0–8.3)

## 2022-03-15 LAB — CBC WITH DIFFERENTIAL/PLATELET
Basophils Absolute: 0.1 10*3/uL (ref 0.0–0.1)
Basophils Relative: 1.2 % (ref 0.0–3.0)
Eosinophils Absolute: 0.1 10*3/uL (ref 0.0–0.7)
Eosinophils Relative: 1.3 % (ref 0.0–5.0)
HCT: 43 % (ref 36.0–46.0)
Hemoglobin: 14.8 g/dL (ref 12.0–15.0)
Lymphocytes Relative: 40.6 % (ref 12.0–46.0)
Lymphs Abs: 2.7 10*3/uL (ref 0.7–4.0)
MCHC: 34.3 g/dL (ref 30.0–36.0)
MCV: 94 fl (ref 78.0–100.0)
Monocytes Absolute: 0.5 10*3/uL (ref 0.1–1.0)
Monocytes Relative: 7.6 % (ref 3.0–12.0)
Neutro Abs: 3.3 10*3/uL (ref 1.4–7.7)
Neutrophils Relative %: 49.3 % (ref 43.0–77.0)
Platelets: 221 10*3/uL (ref 150.0–400.0)
RBC: 4.57 Mil/uL (ref 3.87–5.11)
RDW: 13.3 % (ref 11.5–15.5)
WBC: 6.7 10*3/uL (ref 4.0–10.5)

## 2022-03-15 LAB — T3, FREE: T3, Free: 2.7 pg/mL (ref 2.3–4.2)

## 2022-03-15 LAB — TSH: TSH: 2.45 u[IU]/mL (ref 0.35–5.50)

## 2022-03-15 LAB — T4, FREE: Free T4: 0.85 ng/dL (ref 0.60–1.60)

## 2022-03-16 ENCOUNTER — Encounter: Payer: Self-pay | Admitting: Family Medicine

## 2022-04-05 ENCOUNTER — Encounter: Payer: Self-pay | Admitting: Family Medicine

## 2022-04-11 ENCOUNTER — Other Ambulatory Visit: Payer: Self-pay | Admitting: Family Medicine

## 2022-04-15 ENCOUNTER — Other Ambulatory Visit: Payer: Self-pay | Admitting: Pulmonary Disease

## 2022-04-20 NOTE — Progress Notes (Unsigned)
[place] Phone: [phone] Subjective:   Kristine Kristine [name], am serving as a scribe for Kristine Kristine [name].  I'm seeing this patient by the request  of:  Kristine Olp, MD  CC: right hamstring injury   [id number]  01/20/2022 Continues to have difficulty with this as well as more of a lateral epicondylitis. Discussed with patient about Kristine [name] overhead lifting. Discussed avoiding repetitive extension of the wrist if possible. We discussed ergonomics when working on the computer   Repeat injection given again today. Tolerated the procedure extremely well. Very hopeful that this will make a significant improvement. Hopefully will get at least 6 months out of it. Otherwise we have discussed advanced imaging and surgical intervention if this continues to come back. Patient will follow-up with me again in 3 months.   Updated 04/21/2022 Kristine Kristine [name] is a 67 y.o. female coming in with complaint of L elbow pain. Patient states that she is doing well. Elbow pain has subsided.   Patient had thoracotomy on L side when she was 26. Notes when she is breathing deeply during exercise she struggles to get as much air into the L lung. Dr. Yong Kristine [name] noted issue with her diaphragm on xray which he believed could be contributing.   Pain in R CMC joint and medial aspect of R knee have been bothering her more. Wearing knee sleeve when on treadmill. Notes achy pain when climbing stairs. Kristine [name] pain in knee today.     Past Medical History:  Diagnosis Date   Allergy    GRAVE'S DISEASE 01/24/2007   Past Surgical History:  Procedure Laterality Date   CARPAL TUNNEL RELEASE     3-4 yeras ago from 2016   COLONOSCOPY  2010, 2016   polyp in 2010, none 2016   THORACOTOMY  [id number]   duplicate esophagus   TONSILLECTOMY  1972   Social History   Socioeconomic History   Marital status: Married    Spouse name: Not on file   Number of children: Not  on file   Years of education: Not on file   Highest education level: Not on file  Occupational History   Not on file  Tobacco Use   Smoking status: Never   Smokeless tobacco: Never  Substance and Sexual Activity   Alcohol use: Kristine [name]    Alcohol/week: 0.0 standard drinks of alcohol   Drug use: Kristine [name]   Sexual activity: Not on file  Other Topics Concern   Not on file  Social History Narrative   Married. 53 (grandson 5756) and 46 year old daughters (grandson in late 2020) in 2016. Both live close.    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 LAD, former smoker distant   Lung disease Mother        "shock lung"   Colon cancer Neg Hx    Colon polyps Neg Hx  Rectal cancer Neg Hx    Stomach cancer Neg Hx    Esophageal cancer Neg Hx     Current Outpatient Medications (Endocrine & Metabolic):    methimazole (TAPAZOLE) 10 MG tablet, TAKE 1/2 TABLET BY MOUTH EVERY DAY   Current Outpatient Medications (Respiratory):    diphenhydrAMINE (BENADRYL) 25 MG tablet, Take 25 mg by mouth daily.   fluticasone (FLONASE) 50 MCG/ACT nasal spray, PLACE 1 SPRAY INTO BOTH NOSTRILS 2 (TWO) TIMES DAILY   ipratropium (ATROVENT) 0.06 % nasal spray, Place 1 spray into both nostrils daily.   loratadine (CLARITIN) 10 MG tablet, Take 10 mg by mouth daily.    Current Outpatient Medications (Other):    Cholecalciferol (VITAMIN D3 PO), Take by mouth.   dorzolamide-timolol (COSOPT) 22.3-6.8 MG/ML ophthalmic solution, Place 1 drop into both eyes  daily. Through optho   Misc Natural Products (TURMERIC CURCUMIN) CAPS, Take 1 capsule by mouth daily.   Multiple Vitamin (MULTIVITAMIN) tablet, Take 1 tablet by mouth daily.   Omega-3 Fatty Acids (FISH OIL) 500 MG CAPS, Take by mouth. 1200 mg daily   Tart Cherry 1200 MG CAPS,    Review of Systems:  Kristine [name] headache, visual changes, nausea, vomiting, diarrhea, constipation, dizziness, abdominal pain, skin rash, fevers, chills, night sweats, weight loss, swollen lymph nodes, joint swelling, chest pain, shortness of breath, mood changes. POSITIVE muscle aches, body aches    Objective  Blood pressure 104/72, pulse 61, height '5\' 5"'$  (1.651 m), weight 123 lb (55.8 kg), last menstrual period 03/31/2010.   General: Kristine [name] apparent distress alert and oriented x3 mood and affect normal, dressed appropriately.  HEENT: Pupils equal, extraocular movements intact  Respiratory: Patient's speak in full sentences and does not appear short of breath  Cardiovascular: Kristine [name] lower extremity edema, non tender, Kristine [name] erythema  Right hand does have pain in the [facility] joint  Good grip strength  Patient does have tightness noted of the right hamstring compared to the contralateral side but only mild.  Patient has full strength of the knee.  Worsening pain with resisted flexion of the knee on the medial aspect of the knee.  Negative McMurray's  Limited muscular skeletal ultrasound was performed and interpreted by Hulan [name], M  Limited ultrasound shows that patient does have narrowing the [facility] Mild hypoechoic changes noted in the [facility] joints but more arthritic changes and calcific changes. Impression: CMC arthritis      Impression and Recommendations:     The above documentation has been reviewed and is accurate and complete Kristine Pulley, DO

## 2022-04-21 ENCOUNTER — Other Ambulatory Visit: Payer: Self-pay

## 2022-04-21 ENCOUNTER — Encounter: Payer: Self-pay | Admitting: Family Medicine

## 2022-04-21 ENCOUNTER — Ambulatory Visit: Payer: Managed Care, Other (non HMO) | Admitting: Family Medicine

## 2022-04-21 VITALS — BP 104/72 | HR 61 | Ht 65.0 in | Wt 123.0 lb

## 2022-04-21 DIAGNOSIS — J45990 Exercise induced bronchospasm: Secondary | ICD-10-CM

## 2022-04-21 DIAGNOSIS — M1811 Unilateral primary osteoarthritis of first carpometacarpal joint, right hand: Secondary | ICD-10-CM

## 2022-04-21 DIAGNOSIS — M25522 Pain in left elbow: Secondary | ICD-10-CM

## 2022-04-21 DIAGNOSIS — S76301A Unspecified injury of muscle, fascia and tendon of the posterior muscle group at thigh level, right thigh, initial encounter: Secondary | ICD-10-CM

## 2022-04-21 NOTE — Assessment & Plan Note (Signed)
Questionable asthma versus questionable postviral pneumonitis.  Patient does have some atelectasis we will try albuterol 30 minutes before activity to see if this would make improvement.  Patient will try that and can follow-up in 2 months.  If any worsening symptoms to seek medical attention but I am hopeful this will make a significant improvement

## 2022-04-21 NOTE — Patient Instructions (Signed)
Thumb spica at night-R side Askling exercises Albuterol use 3mn before exercise See me in 2 months

## 2022-04-21 NOTE — Assessment & Plan Note (Signed)
Bone spir noted. Discussed HEP  Discussed bracing at night Topical NSAIDs Worsening pain consider injection or shockwave therapy.  Follow-up again in 2 months

## 2022-04-21 NOTE — Assessment & Plan Note (Signed)
Right hamstring pathology.  Patient does have pain with deceleration only of the knee.  Patient's knee is relatively unremarkable except for some tenderness with resisted flexion.  Discussed potential heel lifts in her regular walking shoes and apply compression sleeve.  Askling exercises given today.  Follow-up again in 2 months

## 2022-05-30 ENCOUNTER — Encounter: Payer: Self-pay | Admitting: Family Medicine

## 2022-05-30 MED ORDER — ALBUTEROL SULFATE HFA 108 (90 BASE) MCG/ACT IN AERS: 2.0000 | INHALATION_SPRAY | RESPIRATORY_TRACT | 6 refills | Status: DC | PRN

## 2022-06-16 NOTE — Progress Notes (Unsigned)
[name] [address] [place] [address] Phone: [phone] Subjective:   Kristine [name], am serving as a scribe for Dr. Antoine Primas.  I'm seeing this patient by the request  of:  [name], MD  CC: 11-month follow-up for right hamstring, elbow pain  [id number]  04/21/2022 Right hamstring pathology.  Patient does have pain with deceleration only of the knee.  Patient's knee is relatively unremarkable except for some tenderness with resisted flexion.  Discussed potential heel lifts in her regular walking shoes and apply compression sleeve.  Askling exercises given today.  Follow-up again in 2 months   Questionable asthma versus questionable postviral pneumonitis.  Patient does have some atelectasis we will try albuterol 30 minutes before activity to see if this would make improvement.  Patient will try that and can follow-up in 2 months.  If any worsening symptoms to seek medical attention but I am hopeful this will make a significant improvement   Updated 06/21/2022 Kristine [name] is a 67 y.o. female coming in with complaint of pain, was having some difficulty with shortness of breath and was given albuterol 2 months ago.  Patient states that her hamstring is better. Exercises helped.   Inhaler helped when running or biking.   She feels like her trigger finger is coming back in R ring finger.   Pain in [facility] joint has lessened when wearing the brace. Pain returns when she does not wear the brace.      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 35 (grandson 5550) and 14 year old daughters (grandson in late 2020) in 2016. Both live close.    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 LAD, former smoker distant   Lung disease Mother        "shock lung"   Colon cancer Neg Hx    Colon polyps Neg Hx  Rectal cancer Neg Hx    Stomach cancer Neg Hx    Esophageal cancer Neg Hx     Current Outpatient Medications (Endocrine & Metabolic):    methimazole (TAPAZOLE) 10 MG tablet, TAKE 1/2 TABLET BY MOUTH EVERY DAY   Current Outpatient Medications (Respiratory):    albuterol (VENTOLIN HFA) 108 (90 Base) MCG/ACT inhaler, Inhale 2 puffs into the lungs every 4 (four) hours as needed for wheezing or shortness of breath.   diphenhydrAMINE (BENADRYL) 25 MG tablet, Take 25 mg by mouth daily.   fluticasone (FLONASE) 50 MCG/ACT nasal spray, PLACE 1 SPRAY INTO BOTH NOSTRILS 2 (TWO) TIMES DAILY   ipratropium (ATROVENT) 0.06 % nasal spray, Place 1 spray into both nostrils daily.   loratadine (CLARITIN) 10 MG tablet, Take 10 mg by mouth daily.    Current Outpatient Medications  (Other):    Cholecalciferol (VITAMIN D3 PO), Take by mouth.   dorzolamide-timolol (COSOPT) 22.3-6.8 MG/ML ophthalmic solution, Place 1 drop into both eyes daily. Through optho   Misc Natural Products (TURMERIC CURCUMIN) CAPS, Take 1 capsule by mouth daily.   Multiple Vitamin (MULTIVITAMIN) tablet, Take 1 tablet by mouth daily.   Omega-3 Fatty Acids (FISH OIL) 500 MG CAPS, Take by mouth. 1200 mg daily   Tart Cherry 1200 MG CAPS,    Reviewed prior external information including notes and imaging from  primary care provider As well as notes that were available from care everywhere and other healthcare systems.  Past medical history, social, surgical and family history all reviewed in electronic medical record.  No pertanent information unless stated regarding to the chief complaint.   Review of Systems:  No headache, visual changes, nausea, vomiting, diarrhea, constipation, dizziness, abdominal pain, skin rash, fevers, chills, night sweats, weight loss, swollen lymph nodes, body aches, joint swelling, chest pain, shortness of breath, mood changes. POSITIVE muscle aches  Objective  Last menstrual period 03/31/2010.   General: No apparent distress alert and oriented x3 mood and affect normal, dressed appropriately.  HEENT: Pupils equal, extraocular movements intact  Respiratory: Patient's speak in full sentences and does not appear short of breath  Cardiovascular: No lower extremity edema, non tender, no erythema  Right leg exam shows    Impression and Recommendations:    The above documentation has been reviewed and is accurate and complete Judi Saa, DO

## 2022-06-21 ENCOUNTER — Other Ambulatory Visit: Payer: Self-pay

## 2022-06-21 ENCOUNTER — Ambulatory Visit: Payer: Managed Care, Other (non HMO) | Admitting: Family Medicine

## 2022-06-21 VITALS — BP 102/72 | HR 60 | Ht 65.0 in | Wt 124.0 lb

## 2022-06-21 DIAGNOSIS — M1811 Unilateral primary osteoarthritis of first carpometacarpal joint, right hand: Secondary | ICD-10-CM | POA: Diagnosis not present

## 2022-06-21 DIAGNOSIS — M79604 Pain in right leg: Secondary | ICD-10-CM

## 2022-06-21 DIAGNOSIS — M65341 Trigger finger, right ring finger: Secondary | ICD-10-CM | POA: Diagnosis not present

## 2022-06-21 NOTE — Patient Instructions (Signed)
Injected finger today See me again in 2 months  

## 2022-06-21 NOTE — Progress Notes (Signed)
Kristine [name] is a 67 y.o. female here for a new problem.  History of Present Illness:   No chief complaint on file.   HPI  Chronic Cough This has been a problem since 09/03/21. No improvement with montelukast. Uses Flonase, ipratropium.   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 LAD, former smoker distant   Lung disease Mother        "shock lung"   Colon cancer Neg Hx    Colon polyps Neg Hx    Rectal cancer Neg Hx    Stomach cancer Neg Hx    Esophageal cancer Neg Hx     No Known Allergies  Current Medications:   Current Outpatient Medications:    albuterol (VENTOLIN HFA) 108 (90 Base) MCG/ACT inhaler, Inhale 2 puffs into the lungs every 4 (four) hours as needed for wheezing or shortness of breath., Disp: 1 each, Rfl: 6   Cholecalciferol (VITAMIN D3 PO), Take by mouth., Disp: , Rfl:    diphenhydrAMINE (BENADRYL) 25 MG tablet, Take 25 mg by mouth daily., Disp: , Rfl:    dorzolamide-timolol (COSOPT) 22.3-6.8 MG/ML ophthalmic solution, Place 1 drop into both eyes daily. Through optho, Disp: 1 mL, Rfl: 0   fluticasone (FLONASE) 50 MCG/ACT nasal spray, PLACE 1 SPRAY INTO BOTH NOSTRILS 2 (TWO) TIMES DAILY, Disp: 48 mL, Rfl: 3   ipratropium (ATROVENT) 0.06 % nasal spray, Place 1 spray into both nostrils daily., Disp: 15 mL, Rfl: 3   loratadine (CLARITIN) 10 MG tablet, Take 10 mg by mouth daily., Disp: , Rfl:     methimazole (TAPAZOLE) 10 MG tablet, TAKE 1/2 TABLET BY MOUTH EVERY DAY, Disp: 45 tablet, Rfl: 3   Misc Natural Products (TURMERIC CURCUMIN) CAPS, Take 1 capsule by mouth daily., Disp: , Rfl:    Multiple Vitamin (MULTIVITAMIN) tablet, Take 1 tablet by mouth daily., Disp: , Rfl:    Omega-3 Fatty Acids (FISH OIL) 500 MG CAPS, Take by mouth. 1200 mg daily, Disp: , Rfl:    Tart Cherry 1200 MG CAPS, , Disp: , Rfl:    Review of Systems:   ROS  Vitals:   There were no vitals filed for this visit.   There is no height or weight on file to calculate BMI.  Physical Exam:   Physical Exam  Assessment and Plan:   ***   I,Alexander Ruley,acting as a scribe for Jarold Motto, PA.,have documented all relevant documentation on the behalf of Jarold Motto, PA,as directed by  Jarold Motto, PA while in the presence of Jarold Motto, [place].   ***   Jarold Motto, PA-C

## 2022-06-22 ENCOUNTER — Encounter: Payer: Self-pay | Admitting: Physician Assistant

## 2022-06-22 ENCOUNTER — Ambulatory Visit: Payer: Managed Care, Other (non HMO) | Admitting: Physician Assistant

## 2022-06-22 VITALS — BP 122/70 | HR 53 | Temp 97.5°F | Ht 65.0 in | Wt 124.4 lb

## 2022-06-22 DIAGNOSIS — R051 Acute cough: Secondary | ICD-10-CM | POA: Diagnosis not present

## 2022-06-22 MED ORDER — AZITHROMYCIN 250 MG PO TABS: ORAL_TABLET | ORAL | 0 refills | Status: AC

## 2022-06-22 MED ORDER — HYDROCOD POLI-CHLORPHE POLI ER 10-8 MG/5ML PO SUER: 5.0000 mL | Freq: Every evening | ORAL | 0 refills | Status: DC | PRN

## 2022-06-22 MED ORDER — AIRSUPRA 90-80 MCG/ACT IN AERO: 2.0000 | INHALATION_SPRAY | Freq: Three times a day (TID) | RESPIRATORY_TRACT | 1 refills | Status: DC | PRN

## 2022-06-22 NOTE — Assessment & Plan Note (Signed)
Continues to have some arthritic changes.  Large bone spur noted.  We did discuss potential for shockwave therapy.

## 2022-06-22 NOTE — Assessment & Plan Note (Signed)
Chronic problem with repeat injection given today.  Worsening slowly.  Patient has responded well to these injections previously and hopefully will continue to do so.  Discussed icing regimen and home exercises otherwise.  Discussed bracing at night still.  Follow-up again in 4 to 6 weeks if needed otherwise every 3 to 6 months

## 2022-06-29 ENCOUNTER — Encounter: Payer: Self-pay | Admitting: Physician Assistant

## 2022-06-29 DIAGNOSIS — J309 Allergic rhinitis, unspecified: Secondary | ICD-10-CM

## 2022-06-30 NOTE — Telephone Encounter (Signed)
I have placed order for referral to ENT, message sent to pt.

## 2022-08-17 NOTE — Progress Notes (Deleted)
[name] [address] [place] [address] Phone: [phone] Subjective:    I'm seeing this patient by the request  of:  [name], MD  CC:   [id number]  06/21/2022 Continues to have some arthritic changes. Large bone spur noted. We did discuss potential for shockwave therapy.   Chronic problem with repeat injection given today. Worsening slowly. Patient has responded well to these injections previously and hopefully will continue to do so. Discussed icing regimen and home exercises otherwise. Discussed bracing at night still. Follow-up again in 4 to 6 weeks if needed otherwise every 3 to 6 months   Updated 08/22/2022 Kristine [name] is a 67 y.o. female coming in with complaint of R trigger finger.      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 3 (grandson 2812) and 47 year old daughters (grandson in late 2020) in 2016. Both live close.    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      Hobbies: read, walk dog, yoga, movies, steelers in the fall    Social Determinants of Health   Financial Resource Strain: Not on file  Food Insecurity: No Food Insecurity (06/21/2022)   Hunger Vital Sign    Worried About Running Out of Food in the Last Year: Never true    Ran Out of Food in the Last Year: Never true  Transportation Needs: No Transportation Needs (06/21/2022)   PRAPARE - Administrator, Civil Service (Medical): No    Lack of Transportation (Non-Medical): No  Physical Activity: Sufficiently Active (06/21/2022)   Exercise Vital Sign    Days of Exercise per Week: 7 days    Minutes of Exercise per Session: 60 min  Stress: No Stress Concern Present (06/21/2022)   [name] of Occupational Health - Occupational Stress Questionnaire    Feeling of Stress : Only a little  Social Connections: Socially Integrated (06/21/2022)   Social Connection and Isolation Panel [NHANES]    Frequency of Communication with Friends and Family: More than three times a week    Frequency of Social Gatherings with Friends and Family: Three times a week    Attends Religious Services: 1 to 4 times per year    Active Member of Clubs or Organizations: Yes    Attends Banker Meetings: More than 4 times per year    Marital Status:  Married   No Known Allergies Family History  Problem Relation Age of Onset   Heart disease Father        82 MI- 4 stents incl. LAD, former smoker distant   Lung disease Mother        "shock lung"   Colon cancer Neg Hx    Colon polyps Neg Hx    Rectal cancer Neg Hx    Stomach cancer Neg Hx    Esophageal cancer Neg Hx     Current Outpatient Medications (Endocrine & Metabolic):    methimazole (TAPAZOLE) 10 MG tablet, TAKE 1/2 TABLET BY MOUTH EVERY DAY   Current Outpatient Medications (Respiratory):    albuterol (VENTOLIN HFA) 108 (90 Base) MCG/ACT inhaler, Inhale 2 puffs into the lungs every 4 (four) hours as needed for wheezing or shortness of breath.   Albuterol-Budesonide (AIRSUPRA) 90-80  MCG/ACT AERO, Inhale 2 puffs into the lungs 3 (three) times daily as needed.   chlorpheniramine-HYDROcodone (TUSSIONEX) 10-8 MG/5ML, Take 5 mLs by mouth at bedtime as needed for cough.   diphenhydrAMINE (BENADRYL) 25 MG tablet, Take 25 mg by mouth daily.   fluticasone (FLONASE) 50 MCG/ACT nasal spray, PLACE 1 SPRAY INTO BOTH NOSTRILS 2 (TWO) TIMES DAILY   ipratropium (ATROVENT) 0.06 % nasal spray, Place 1 spray into both nostrils daily.   loratadine (CLARITIN) 10 MG tablet, Take 10 mg by mouth daily.    Current Outpatient Medications (Other):    Cholecalciferol (VITAMIN D3 PO), Take by mouth.   dorzolamide-timolol (COSOPT) 22.3-6.8 MG/ML ophthalmic solution, Place 1 drop into both eyes daily. Through optho   Misc Natural Products (TURMERIC CURCUMIN) CAPS, Take 1 capsule by mouth daily.   Multiple Vitamin (MULTIVITAMIN) tablet, Take 1 tablet by mouth daily.   Omega-3 Fatty Acids (FISH OIL) 500 MG CAPS, Take by mouth. 1200 mg daily   Tart Cherry 1200 MG CAPS,    Reviewed prior external information including notes and imaging from  primary care provider As well as notes that were available from care everywhere and other healthcare systems.  Past medical history, social, surgical and family history all reviewed in electronic medical record.  No pertanent information unless stated regarding to the chief complaint.   Review of Systems:  No headache, visual changes, nausea, vomiting, diarrhea, constipation, dizziness, abdominal pain, skin rash, fevers, chills, night sweats, weight loss, swollen lymph nodes, body aches, joint swelling, chest pain, shortness of breath, mood changes. POSITIVE muscle aches  Objective  Last menstrual period 03/31/2010.   General: No apparent distress alert and oriented x3 mood and affect normal, dressed appropriately.  HEENT: Pupils equal, extraocular movements intact  Respiratory: Patient's speak in full sentences and does not appear short of breath   Cardiovascular: No lower extremity edema, non tender, no erythema      Impression and Recommendations:

## 2022-08-22 ENCOUNTER — Ambulatory Visit: Payer: Managed Care, Other (non HMO) | Admitting: Family Medicine

## 2022-09-29 NOTE — Progress Notes (Signed)
[name] [address] [place] [address] Phone: [phone] Subjective:   [name], am serving as a scribe for Dr. Antoine Primas.  I'm seeing this patient by the request  of:  [name], MD  CC: right leg, trigger finger   [id number]  06/21/2022 Continues to have some arthritic changes. Large bone spur noted. We did discuss potential for shockwave therapy.  Chronic problem with repeat injection given today.  Worsening slowly.  Patient has responded well to these injections previously and hopefully will continue to do so.  Discussed icing regimen and home exercises otherwise.  Discussed bracing at night still.  Follow-up again in 4 to 6 weeks if needed otherwise every 3 to 6 months     Updated 09/30/2022 Kristine [name] is a 67 y.o. female coming in with complaint of R leg and trigger fingers. Here to talk about everything. Check thumbs. No trigger finger. R thumb the worse. Epi may make hip better. Still hurts when laying down.       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 41 (grandson [id number]) and 58  year old daughters (grandson in late 2020) in 2016. Both live close.    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      Hobbies: read, walk dog, yoga, movies, steelers in the fall   Social Determinants of Health   Financial Resource Strain: Not on file  Food Insecurity: No Food Insecurity (06/21/2022)   Hunger Vital Sign    Worried About Running Out of Food in the Last Year: Never true    Ran Out of Food in the Last Year: Never true  Transportation Needs: No Transportation Needs (06/21/2022)   PRAPARE - Administrator, Civil Service (Medical): No    Lack of Transportation (Non-Medical): No  Physical Activity: Sufficiently Active (06/21/2022)   Exercise Vital Sign    Days of Exercise per Week: 7 days    Minutes of Exercise per Session: 60 min  Stress: No Stress Concern Present (06/21/2022)   [name] of Occupational Health - Occupational Stress Questionnaire    Feeling of Stress : Only a little  Social Connections: Socially Integrated (06/21/2022)   Social Connection and Isolation Panel [NHANES]    Frequency of Communication with Friends and Family: More than three times a week    Frequency of Social Gatherings  with Friends and Family: Three times a week    Attends Religious Services: 1 to 4 times per year    Active Member of Clubs or Organizations: Yes    Attends Engineer, structural: More than 4 times per year    Marital Status: Married   No Known Allergies Family History  Problem Relation Age of Onset   Heart disease Father        37 MI- 4 stents incl. LAD, former smoker distant   Lung disease Mother        "shock lung"   Colon cancer Neg Hx    Colon polyps Neg Hx    Rectal cancer Neg Hx    Stomach cancer Neg Hx    Esophageal cancer Neg Hx     Current Outpatient Medications (Endocrine & Metabolic):    methimazole (TAPAZOLE) 10 MG tablet, TAKE 1/2 TABLET BY MOUTH EVERY DAY   Current  Outpatient Medications (Respiratory):    albuterol (VENTOLIN HFA) 108 (90 Base) MCG/ACT inhaler, Inhale 2 puffs into the lungs every 4 (four) hours as needed for wheezing or shortness of breath.   Albuterol-Budesonide (AIRSUPRA) 90-80 MCG/ACT AERO, Inhale 2 puffs into the lungs 3 (three) times daily as needed.   chlorpheniramine-HYDROcodone (TUSSIONEX) 10-8 MG/5ML, Take 5 mLs by mouth at bedtime as needed for cough.   diphenhydrAMINE (BENADRYL) 25 MG tablet, Take 25 mg by mouth daily.   fluticasone (FLONASE) 50 MCG/ACT nasal spray, PLACE 1 SPRAY INTO BOTH NOSTRILS 2 (TWO) TIMES DAILY   ipratropium (ATROVENT) 0.06 % nasal spray, Place 1 spray into both nostrils daily.   loratadine (CLARITIN) 10 MG tablet, Take 10 mg by mouth daily.    Current Outpatient Medications (Other):    Cholecalciferol (VITAMIN D3 PO), Take by mouth.   dorzolamide-timolol (COSOPT) 22.3-6.8 MG/ML ophthalmic solution, Place 1 drop into both eyes daily. Through optho   Misc Natural Products (TURMERIC CURCUMIN) CAPS, Take 1 capsule by mouth daily.   Multiple Vitamin (MULTIVITAMIN) tablet, Take 1 tablet by mouth daily.   Omega-3 Fatty Acids (FISH OIL) 500 MG CAPS, Take by mouth. 1200 mg daily   Tart Cherry 1200 MG CAPS,    Reviewed prior external information including notes and imaging from  primary care provider As well as notes that were available from care everywhere and other healthcare systems.  Past medical history, social, surgical and family history all reviewed in electronic medical record.  No pertanent information unless stated regarding to the chief complaint.   Review of Systems:  No headache, visual changes, nausea, vomiting, diarrhea, constipation, dizziness, abdominal pain, skin rash, fevers, chills, night sweats, weight loss, swollen lymph nodes, body aches, joint swelling, chest pain, shortness of breath, mood changes. POSITIVE muscle aches  Objective  Blood pressure 104/62, pulse (!) 48, height 5\' 5"   (1.651 m), weight 122 lb (55.3 kg), last menstrual period 03/31/2010, SpO2 97%.   General: No apparent distress alert and oriented x3 mood and affect normal, dressed appropriately.  HEENT: Pupils equal, extraocular movements intact  Respiratory: Patient's speak in full sentences and does not appear short of breath  Cardiovascular: No lower extremity edema, non tender, no erythema  Right leg does have some tenderness to palpation noted.  Patient seems to be more tender over the piriformis than anywhere else today.  Back exam seems to be relatively unremarkable.  Only pain with FABER test that seems to wrap around and give some radicular symptoms down the right leg.  Procedure: Real-time Ultrasound Guided Injection  of right piriformis tendon sheath Device: GE Logiq Q7 Ultrasound guided injection is preferred based studies that show increased duration, increased effect, greater accuracy, decreased procedural pain, increased response rate, and decreased cost with ultrasound guided versus blind injection.  Verbal informed consent obtained.  Time-out conducted.  Noted no overlying erythema, induration, or other signs of local infection.  Skin prepped in a sterile fashion.  Local anesthesia: Topical Ethyl chloride.  With sterile technique and under real time ultrasound guidance: With a 21-gauge 2 inch needle injected with 2 cc of 0.5% Marcaine and injected with 1 cc of Kenalog 40 mg/mL. Completed without difficulty  Pain immediately improved Advised to call if fevers/chills, erythema, induration, drainage, or persistent bleeding.  Impression: Technically successful ultrasound guided injection.    Impression and Recommendations:    The above documentation has been reviewed and is accurate and complete Judi Saa, DO

## 2022-09-30 ENCOUNTER — Encounter: Payer: Self-pay | Admitting: Family Medicine

## 2022-09-30 ENCOUNTER — Ambulatory Visit (INDEPENDENT_AMBULATORY_CARE_PROVIDER_SITE_OTHER): Payer: Managed Care, Other (non HMO) | Admitting: Family Medicine

## 2022-09-30 VITALS — BP 104/62 | HR 48 | Ht 65.0 in | Wt 122.0 lb

## 2022-09-30 DIAGNOSIS — M1811 Unilateral primary osteoarthritis of first carpometacarpal joint, right hand: Secondary | ICD-10-CM

## 2022-09-30 DIAGNOSIS — G5701 Lesion of sciatic nerve, right lower limb: Secondary | ICD-10-CM | POA: Insufficient documentation

## 2022-09-30 NOTE — Assessment & Plan Note (Signed)
Patient given injection and tolerated the procedure well, discussed icing regimen and home exercises, discussed which activities to do and which ones to avoid.  Increase activity slowly otherwise.  Patient did respond extremely well and had improvement in range of motion of the hip immediately as well as decrease in pain.  Patient will follow-up again in 2 months otherwise

## 2022-09-30 NOTE — Assessment & Plan Note (Signed)
Still known arthritic changes.  Discussed icing regimen and home exercises, discussed with patient about potential bracing.  Discussed regular ergonomics with patient being on the bike and being very active.  Follow-up again in 2 months

## 2022-09-30 NOTE — Patient Instructions (Signed)
Injections today See you again in 2 months

## 2022-11-09 LAB — HM MAMMOGRAPHY

## 2022-11-25 NOTE — Progress Notes (Unsigned)
[name] [address] [place] [address] Phone: [phone] Subjective:   Kristine [name], am serving as a scribe for Dr. Antoine Primas.  I'm seeing this patient by the request  of:  [name], MD  CC:   ION:[id number]  09/30/2022 Patient given injection and tolerated the procedure well, discussed icing regimen and home exercises, discussed which activities to do and which ones to avoid.  Increase activity slowly otherwise.  Patient did respond extremely well and had improvement in range of motion of the hip immediately as well as decrease in pain.  Patient will follow-up again in 2 months otherwise     Still known arthritic changes.  Discussed icing regimen and home exercises, discussed with patient about potential bracing.  Discussed regular ergonomics with patient being on the bike and being very active.  Follow-up again in 2 months     Updated 11/28/2022 Kristine [name] is a 67 y.o. female coming in with complaint of thumb and hip pain, last seen was given piriformis tendon sheath injection.  This was 2 months ago.  Patient states injection from last visit still working. Cut down on yoga. Take a look at thumbs and fingers.      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 80 (grandson 1763) and 33 year old daughters (grandson in late 2020) in 2016. Both live close.    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      Hobbies: read, walk dog, yoga, movies, steelers in the fall   Social Determinants of Health   Financial Resource Strain: Not on file  Food Insecurity: No Food Insecurity (06/21/2022)   Hunger Vital Sign    Worried About Running Out of Food in the Last Year: Never true    Ran Out of Food in the Last Year: Never true  Transportation Needs: No Transportation Needs (06/21/2022)   PRAPARE - Administrator, Civil Service (Medical): No    Lack of Transportation (Non-Medical): No  Physical Activity: Sufficiently Active (06/21/2022)   Exercise Vital Sign    Days of Exercise per Week: 7 days    Minutes of Exercise per Session: 60 min  Stress: No Stress Concern Present (06/21/2022)   [name] of Occupational Health - Occupational Stress Questionnaire    Feeling of Stress : Only a little  Social Connections: Socially Integrated (  06/21/2022)   Social Connection and Isolation Panel [NHANES]    Frequency of Communication with Friends and Family: More than three times a week    Frequency of Social Gatherings with Friends and Family: Three times a week    Attends Religious Services: 1 to 4 times per year    Active Member of Clubs or Organizations: Yes    Attends Engineer, structural: More than 4 times per year    Marital Status: Married   No Known Allergies Family History  Problem Relation Age of Onset   Heart disease Father        44 MI- 4 stents incl. LAD, former smoker distant   Lung disease Mother        "shock lung"   Colon cancer Neg Hx    Colon polyps Neg Hx    Rectal cancer Neg Hx     Stomach cancer Neg Hx    Esophageal cancer Neg Hx     Current Outpatient Medications (Endocrine & Metabolic):    methimazole (TAPAZOLE) 10 MG tablet, TAKE 1/2 TABLET BY MOUTH EVERY DAY   Current Outpatient Medications (Respiratory):    albuterol (VENTOLIN HFA) 108 (90 Base) MCG/ACT inhaler, Inhale 2 puffs into the lungs every 4 (four) hours as needed for wheezing or shortness of breath.   Albuterol-Budesonide (AIRSUPRA) 90-80 MCG/ACT AERO, Inhale 2 puffs into the lungs 3 (three) times daily as needed.   chlorpheniramine-HYDROcodone (TUSSIONEX) 10-8 MG/5ML, Take 5 mLs by mouth at bedtime as needed for cough.   diphenhydrAMINE (BENADRYL) 25 MG tablet, Take 25 mg by mouth daily.   fluticasone (FLONASE) 50 MCG/ACT nasal spray, PLACE 1 SPRAY INTO BOTH NOSTRILS 2 (TWO) TIMES DAILY   ipratropium (ATROVENT) 0.06 % nasal spray, Place 1 spray into both nostrils daily.   loratadine (CLARITIN) 10 MG tablet, Take 10 mg by mouth daily.    Current Outpatient Medications (Other):    Cholecalciferol (VITAMIN D3 PO), Take by mouth.   dorzolamide-timolol (COSOPT) 22.3-6.8 MG/ML ophthalmic solution, Place 1 drop into both eyes daily. Through optho   Misc Natural Products (TURMERIC CURCUMIN) CAPS, Take 1 capsule by mouth daily.   Multiple Vitamin (MULTIVITAMIN) tablet, Take 1 tablet by mouth daily.   Omega-3 Fatty Acids (FISH OIL) 500 MG CAPS, Take by mouth. 1200 mg daily   Tart Cherry 1200 MG CAPS,    Reviewed prior external information including notes and imaging from  primary care provider As well as notes that were available from care everywhere and other healthcare systems.  Past medical history, social, surgical and family history all reviewed in electronic medical record.  No pertanent information unless stated regarding to the chief complaint.   Review of Systems:  No headache, visual changes, nausea, vomiting, diarrhea, constipation, dizziness, abdominal pain, skin rash, fevers, chills, night  sweats, weight loss, swollen lymph nodes, body aches, joint swelling, chest pain, shortness of breath, mood changes. POSITIVE muscle aches  Objective  Blood pressure 120/66, height 5\' 5"  (1.651 m), weight 121 lb (54.9 kg), last menstrual period 03/31/2010, SpO2 97%.   General: No apparent distress alert and oriented x3 mood and affect normal, dressed appropriately.  HEENT: Pupils equal, extraocular movements intact  Respiratory: Patient's speak in full sentences and does not appear short of breath  Cardiovascular: No lower extremity edema, non tender, no erythema  Does have some difficulty in the scapular area.  Does have tightness noted.  Tenderness to palpation noted.  Right CMC joint does have some arthritic changes and  positive grind test noted.  Limited muscular skeletal ultrasound was performed and interpreted by Antoine Primas, M   Limited ultrasound of patient's [facility] joint does have some mild hypoechoic changes noted.  Does have narrowing noted with a large osteophyte. Impression: CMC arthritis   Impression and Recommendations:     The above documentation has been reviewed and is accurate and complete Judi Saa, DO

## 2022-11-28 ENCOUNTER — Encounter: Payer: Self-pay | Admitting: Family Medicine

## 2022-11-28 ENCOUNTER — Ambulatory Visit (INDEPENDENT_AMBULATORY_CARE_PROVIDER_SITE_OTHER): Payer: Managed Care, Other (non HMO) | Admitting: Family Medicine

## 2022-11-28 ENCOUNTER — Other Ambulatory Visit: Payer: Self-pay

## 2022-11-28 VITALS — BP 120/66 | Ht 65.0 in | Wt 121.0 lb

## 2022-11-28 DIAGNOSIS — M1811 Unilateral primary osteoarthritis of first carpometacarpal joint, right hand: Secondary | ICD-10-CM | POA: Diagnosis not present

## 2022-11-28 DIAGNOSIS — M940 Chondrocostal junction syndrome [Tietze]: Secondary | ICD-10-CM | POA: Diagnosis not present

## 2022-11-28 NOTE — Patient Instructions (Signed)
Great check in Vertical mouse See me again in 3months

## 2022-11-28 NOTE — Assessment & Plan Note (Signed)
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  Discussed icing regimen and home exercises, discussed which activities to do and which ones to avoid.  Increase activity slowly.  Could be a candidate for possible osteopathic manipulation if needed.

## 2022-11-28 NOTE — Assessment & Plan Note (Signed)
Stable overall, has it on the contralateral side as well but not as severe.  Discussed icing regimen and home exercises, worsening.  Again.  Will continue to monitor.  Follow-up again 12 weeks.

## 2022-11-30 ENCOUNTER — Other Ambulatory Visit: Payer: Self-pay | Admitting: Family Medicine

## 2023-02-20 NOTE — Progress Notes (Signed)
 [name] [address] [place] [address] Phone: [phone] Subjective:   Kristine [name], am serving as a scribe for Dr. Arthea Claudene.  I'Kristine seeing this patient by the request  of:  Kristine Garnette KIDD, MD  CC: Back and neck pain  YEP:[id number]  Kristine [name] is a 68 y.o. female coming in with complaint of back and neck pain. OMT 11/28/2022. Also f/u for R thumb pain. Patient states that L hand middle & ring and R hand ring finger are triggering again.   Sleeping on pillow due to pain in R glute and lateral upper leg. Pain radiates down to lateral aspect of R knee. Able to walk, run, bike but has pain with yoga and trying to sleep at night.   Epidurals were not helpful but injections given in office in August were helpful.   Also feels like she has slipped rib on L side.   Medications patient has been prescribed: None  Taking:         Reviewed prior external information including notes and imaging from previsou exam, outside providers and external EMR if available.   As well as notes that were available from care everywhere and other healthcare systems.  Past medical history, social, surgical and family history all reviewed in electronic medical record.  No pertanent information unless stated regarding to the chief complaint.   Past Medical History:  Diagnosis Date   Allergy    GRAVE'S DISEASE 01/24/2007    No Known Allergies   Review of Systems:  No headache, visual changes, nausea, vomiting, diarrhea, constipation, dizziness, abdominal pain, skin rash, fevers, chills, night sweats, weight loss, swollen lymph nodes, body aches, joint swelling, chest pain, shortness of breath, mood changes. POSITIVE muscle aches  Objective  Blood pressure 100/68, pulse 67, height 5' 5 (1.651 Kristine), weight 120 lb (54.4 kg), last menstrual period 03/31/2010.   General: No apparent distress alert and oriented x3 mood and affect normal, dressed  appropriately.  HEENT: Pupils equal, extraocular movements intact  Respiratory: Patient's speak in full sentences and does not appear short of breath  Cardiovascular: No lower extremity edema, non tender, no erythema  Gait normal  MSK:  Back loss of lordosis, tightness in the right hip positive FABER.  Negative SLT   Osteopathic findings  C2 flexed rotated and side bent right C5 flexed rotated and side bent left T3 extended rotated and side bent right inhaled rib T9 extended rotated and side bent left L1 flexed rotated and side bent right Sacrum right on right     Assessment and Plan:  Piriformis syndrome of right side Patient 4 months ago and was given an injection and tolerated the procedure well.  After discussing multiple different treatment options with patient she has elected to try a PRP injection in the area.  The patient having multiple trigger fingers especially of the ring fingers bilaterally we can do this as well.  With patient having the history of some mild glaucoma I think that this is a safer option for her as well it long-term.  Patient will be set up at her next convenience.  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  Discussed icing regimen and home exercises, discussed which activities to do and which ones to avoid.  Discussed ergonomics throughout the day.  Follow-up again in 6 to 8  weeks otherwise.    Nonallopathic problems  Decision today to treat with OMT was based on Physical Exam  After verbal consent patient was treated with HVLA, ME, FPR techniques in thoracic, lumbar, and sacral  areas  Patient tolerated the procedure well with improvement in symptoms  Patient given exercises, stretches and lifestyle modifications  See medications in patient instructions if given  Patient will follow up in 4-8  weeks    The above documentation has been reviewed and is accurate and complete Kristine [name] Kristine Json Koelzer, DO          Note: This dictation was prepared with Dragon dictation along with smaller phrase technology. Any transcriptional errors that result from this process are unintentional.

## 2023-02-21 ENCOUNTER — Ambulatory Visit (INDEPENDENT_AMBULATORY_CARE_PROVIDER_SITE_OTHER): Payer: Managed Care, Other (non HMO) | Admitting: Family Medicine

## 2023-02-21 ENCOUNTER — Other Ambulatory Visit: Payer: Self-pay

## 2023-02-21 VITALS — BP 100/68 | HR 67 | Ht 65.0 in | Wt 120.0 lb

## 2023-02-21 DIAGNOSIS — M79644 Pain in right finger(s): Secondary | ICD-10-CM | POA: Diagnosis not present

## 2023-02-21 DIAGNOSIS — M9902 Segmental and somatic dysfunction of thoracic region: Secondary | ICD-10-CM | POA: Diagnosis not present

## 2023-02-21 DIAGNOSIS — M940 Chondrocostal junction syndrome [Tietze]: Secondary | ICD-10-CM

## 2023-02-21 DIAGNOSIS — M1811 Unilateral primary osteoarthritis of first carpometacarpal joint, right hand: Secondary | ICD-10-CM

## 2023-02-21 DIAGNOSIS — M9903 Segmental and somatic dysfunction of lumbar region: Secondary | ICD-10-CM

## 2023-02-21 DIAGNOSIS — G5701 Lesion of sciatic nerve, right lower limb: Secondary | ICD-10-CM

## 2023-02-21 DIAGNOSIS — G8929 Other chronic pain: Secondary | ICD-10-CM

## 2023-02-21 DIAGNOSIS — M9904 Segmental and somatic dysfunction of sacral region: Secondary | ICD-10-CM | POA: Diagnosis not present

## 2023-02-21 NOTE — Patient Instructions (Signed)
 Good to see you PRP SI and fingers after March 1st Another appt for manipulation in 2 months

## 2023-02-22 ENCOUNTER — Encounter: Payer: Self-pay | Admitting: Family Medicine

## 2023-02-22 NOTE — Assessment & Plan Note (Signed)
 Continue to have slipped rib syndrome but responded extremely well to osteopathic manipulation after further evaluation today.  Discussed icing regimen and home exercises, discussed which activities to do and which ones to avoid.  Discussed ergonomics throughout the day.  Follow-up again in 6 to 8 weeks otherwise.

## 2023-02-22 NOTE — Assessment & Plan Note (Signed)
 Patient 4 months ago and was given an injection and tolerated the procedure well.  After discussing multiple different treatment options with patient she has elected to try a PRP injection in the area.  The patient having multiple trigger fingers especially of the ring fingers bilaterally we can do this as well.  With patient having the history of some mild glaucoma I think that this is a safer option for her as well it long-term.  Patient will be set up at her next convenience.  Total time with patient today 31 minutes

## 2023-02-22 NOTE — Assessment & Plan Note (Signed)
 Will get [facility]

## 2023-03-08 ENCOUNTER — Other Ambulatory Visit: Payer: Self-pay | Admitting: Nurse Practitioner

## 2023-03-08 ENCOUNTER — Telehealth: Payer: Managed Care, Other (non HMO) | Admitting: Nurse Practitioner

## 2023-03-08 DIAGNOSIS — J0140 Acute pansinusitis, unspecified: Secondary | ICD-10-CM

## 2023-03-08 MED ORDER — DOXYCYCLINE HYCLATE 100 MG PO TABS
100.0000 mg | ORAL_TABLET | Freq: Two times a day (BID) | ORAL | 0 refills | Status: DC
Start: 2023-03-08 — End: 2023-03-08

## 2023-03-08 MED ORDER — DOXYCYCLINE HYCLATE 100 MG PO TABS
100.0000 mg | ORAL_TABLET | Freq: Two times a day (BID) | ORAL | 0 refills | Status: AC
Start: 2023-03-08 — End: 2023-03-18

## 2023-03-08 NOTE — Progress Notes (Signed)

## 2023-03-16 ENCOUNTER — Encounter: Payer: Managed Care, Other (non HMO) | Admitting: Family Medicine

## 2023-04-07 ENCOUNTER — Other Ambulatory Visit: Payer: Self-pay | Admitting: Family Medicine

## 2023-04-08 ENCOUNTER — Other Ambulatory Visit: Payer: Self-pay | Admitting: Pulmonary Disease

## 2023-04-13 NOTE — Progress Notes (Signed)
 [name] [address] [place] [address] Phone: [phone] Subjective:   [name], am serving as a scribe for Dr. Antoine Primas.  I'm seeing this patient by the request  of:  [name], MD  CC: Multiple complaints and will get PRP today  BJY:NWGNFAOZHY  02/21/2023 Will get [facility]     Continue to have slipped rib syndrome but responded extremely well to osteopathic manipulation after further evaluation today.  Discussed icing regimen and home exercises, discussed which activities to do and which ones to avoid.  Discussed ergonomics throughout the day.  Follow-up again in 6 to 8 weeks otherwise.    Patient 4 months ago and was given an injection and tolerated the procedure well. After discussing multiple different treatment options with patient she has elected to try a PRP injection in the area. The patient having multiple trigger fingers especially of the ring fingers bilaterally we can do this as well. With patient having the history of some mild glaucoma I think that this is a safer option for her as well it long-term. Patient will be set up at her next convenience. Total time with patient today 31 minutes   Updated 04/18/2023 Kristine [name] is a 68 y.o. female coming in with complaint of here for PRP in finger and piriformis. Patient states that her L middle finger has cyst near metacarpal head. Has triggering but feels this is due to ganglion cyst.   Steroid injection in L SI joint gave her about 4 months. This was helpful but she would like to try PRP. Pain is not in SI joint but in R GT. Is able to bike and run but painful afterwards.   PRP in R thumb and R ring finger.        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 69 (grandson 2159) and 31 year old daughters (grandson in late 2020) in 2016. Both live close.    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      Hobbies: read, walk dog, yoga, movies, steelers in the fall   Social Drivers of Health   Financial Resource Strain: Not on file  Food Insecurity: No Food Insecurity (06/21/2022)   Hunger Vital Sign    Worried About Running Out of Food in the Last Year: Never true    Ran Out of Food in the  Last Year: Never true  Transportation Needs: No Transportation Needs (06/21/2022)   PRAPARE - Administrator, Civil Service (Medical): No    Lack of Transportation (Non-Medical): No  Physical Activity: Sufficiently Active (06/21/2022)   Exercise Vital Sign    Days of Exercise per Week: 7 days    Minutes of Exercise per Session: 60 min  Stress: No Stress Concern Present (06/21/2022)   [name] of Occupational Health - Occupational Stress Questionnaire    Feeling of Stress : Only a little  Social Connections: Socially Integrated (06/21/2022)   Social Connection and Isolation Panel [NHANES]    Frequency of Communication with Friends and Family: More than three times a week    Frequency of Social Gatherings with Friends and Family: Three times a week     Attends Religious Services: 1 to 4 times per year    Active Member of Clubs or Organizations: Yes    Attends Engineer, structural: More than 4 times per year    Marital Status: Married   No Known Allergies Family History  Problem Relation Age of Onset   Heart disease Father        33 MI- 4 stents incl. LAD, former smoker distant   Lung disease Mother        "shock lung"   Colon cancer Neg Hx    Colon polyps Neg Hx    Rectal cancer Neg Hx    Stomach cancer Neg Hx    Esophageal cancer Neg Hx     Current Outpatient Medications (Endocrine & Metabolic):    methimazole (TAPAZOLE) 10 MG tablet, TAKE 1/2 TABLET BY MOUTH DAILY   Current Outpatient Medications (Respiratory):    albuterol (VENTOLIN HFA) 108 (90 Base) MCG/ACT inhaler, Inhale 2 puffs into the lungs every 4 (four) hours as needed for wheezing or shortness of breath.   diphenhydrAMINE (BENADRYL) 25 MG tablet, Take 25 mg by mouth daily.   fluticasone (FLONASE) 50 MCG/ACT nasal spray, PLACE 1 SPRAY INTO BOTH NOSTRILS 2 (TWO) TIMES DAILY   ipratropium (ATROVENT) 0.06 % nasal spray, SPRAY 1 SPRAY INTO BOTH NOSTRILS DAILY.   loratadine (CLARITIN) 10 MG tablet, Take 10 mg by mouth daily.    Current Outpatient Medications (Other):    Cholecalciferol (VITAMIN D3 PO), Take by mouth.   dorzolamide-timolol (COSOPT) 22.3-6.8 MG/ML ophthalmic solution, Place 1 drop into both eyes daily. Through optho   Misc Natural Products (TURMERIC CURCUMIN) CAPS, Take 1 capsule by mouth daily.   Multiple Vitamin (MULTIVITAMIN) tablet, Take 1 tablet by mouth daily.   Omega-3 Fatty Acids (FISH OIL) 500 MG CAPS, Take by mouth. 1200 mg daily   Tart Cherry 1200 MG CAPS,    Objective  Blood pressure 108/62, pulse (!) 50, height 5\' 5"  (1.651 m), weight 121 lb (54.9 kg), last menstrual period 03/31/2010, SpO2 96%.   General: No apparent distress alert and oriented x3 mood and affect normal, dressed appropriately.   Procedure: Real-time  Ultrasound Guided Injection of right CMC joint Device: GE Logiq Q7 Ultrasound guided injection is preferred based studies that show increased duration, increased effect, greater accuracy, decreased procedural pain, increased response rate, and decreased cost with ultrasound guided versus blind injection.  Verbal informed consent obtained.  Time-out conducted.  Noted no overlying erythema, induration, or other signs of local infection.  Skin prepped in a sterile fashion.  Local anesthesia: Topical Ethyl chloride.  With sterile technique and under real time ultrasound guidance: With a 25-gauge half inch needle  injected with 0.5 cc of 0.5% Marcaine then injected 1.5 cc of PRP Completed without difficulty  Pain immediately resolved suggesting accurate placement of the medication.  Advised to call if fevers/chills, erythema, induration, drainage, or persistent bleeding.  Impression: Technically successful ultrasound guided injection.  Procedure: Real-time Ultrasound Guided Injection of right fourth finger tendon sheath Device: GE Logiq Q7 Ultrasound guided injection is preferred based studies that show increased duration, increased effect, greater accuracy, decreased procedural pain, increased response rate, and decreased cost with ultrasound guided versus blind injection.  Verbal informed consent obtained.  Time-out conducted.  Noted no overlying erythema, induration, or other signs of local infection.  Skin prepped in a sterile fashion.  Local anesthesia: Topical Ethyl chloride.  With sterile technique and under real time ultrasound guidance: With a 21-gauge 2 inch needle injected with 1 cc of 0.5% Marcaine and then 1 cc of PRP Completed without difficulty  Pain immediately resolved suggesting accurate placement of the medication.  Advised to call if fevers/chills, erythema, induration, drainage, or persistent bleeding.  Impression: Technically successful ultrasound guided  injection.  Procedure: Real-time Ultrasound Guided Injection of left ganglion cyst of hand Device: GE Logiq Q7 Ultrasound guided injection is preferred based studies that show increased duration, increased effect, greater accuracy, decreased procedural pain, increased response rate, and decreased cost with ultrasound guided versus blind injection.  Verbal informed consent obtained.  Time-out conducted.  Noted no overlying erythema, induration, or other signs of local infection.  Skin prepped in a sterile fashion.  Local anesthesia: Topical Ethyl chloride.  With sterile technique and under real time ultrasound guidance: With a 21-gauge 2 inch needle injected with 0.5 cc of 0.5% Marcaine and as well as 1 cc of PRP Completed without difficulty  Pain immediately resolved suggesting accurate placement of the medication.  Advised to call if fevers/chills, erythema, induration, drainage, or persistent bleeding.  Images saved Impression: Technically successful ultrasound guided injection.  Procedure: Real-time Ultrasound Guided Injection of left piriformis tendon sheath Device: GE Logiq Q7 Ultrasound guided injection is preferred based studies that show increased duration, increased effect, greater accuracy, decreased procedural pain, increased response rate, and decreased cost with ultrasound guided versus blind injection.  Verbal informed consent obtained.  Time-out conducted.  Noted no overlying erythema, induration, or other signs of local infection.  Skin prepped in a sterile fashion.  Local anesthesia: Topical Ethyl chloride.  With sterile technique and under real time ultrasound guidance: With a 21-gauge 2 inch needle injected with 1 cc of 0.5% Marcaine and then injected with 3 cc of PRP Completed without difficulty  Pain immediately resolved suggesting accurate placement of the medication.  Advised to call if fevers/chills, erythema, induration, drainage, or persistent bleeding.  Images  saved Impression: Technically successful ultrasound guided injection.    Impression and Recommendations:     The above documentation has been reviewed and is accurate and complete Judi Saa, DO

## 2023-04-17 ENCOUNTER — Ambulatory Visit: Payer: Self-pay | Admitting: Family Medicine

## 2023-04-17 ENCOUNTER — Encounter: Payer: Self-pay | Admitting: Family Medicine

## 2023-04-17 ENCOUNTER — Other Ambulatory Visit: Payer: Self-pay

## 2023-04-17 VITALS — BP 108/62 | HR 50 | Ht 65.0 in | Wt 121.0 lb

## 2023-04-17 DIAGNOSIS — M1811 Unilateral primary osteoarthritis of first carpometacarpal joint, right hand: Secondary | ICD-10-CM

## 2023-04-17 DIAGNOSIS — M65341 Trigger finger, right ring finger: Secondary | ICD-10-CM

## 2023-04-17 DIAGNOSIS — M79641 Pain in right hand: Secondary | ICD-10-CM

## 2023-04-17 DIAGNOSIS — M67442 Ganglion, left hand: Secondary | ICD-10-CM

## 2023-04-17 DIAGNOSIS — G5701 Lesion of sciatic nerve, right lower limb: Secondary | ICD-10-CM

## 2023-04-17 NOTE — Assessment & Plan Note (Signed)
 Post PRP injection and discussed icing regimen and home exercises.

## 2023-04-17 NOTE — Assessment & Plan Note (Signed)
Post PRP handout given.

## 2023-04-17 NOTE — Assessment & Plan Note (Signed)
 Left side given with patient having better results with it previously.  Hopefully will make a big improvement again.

## 2023-04-17 NOTE — Assessment & Plan Note (Signed)
 Patient given injection and tolerated the procedure well, discussed icing regimen and home exercises, discussed avoiding the icing initially and the post PRP instructions.

## 2023-04-17 NOTE — Patient Instructions (Signed)
No ice or IBU for 3 days Heat and Tylenol are ok See me again in 7-8 weeks  

## 2023-04-18 ENCOUNTER — Encounter: Payer: Self-pay | Admitting: Family Medicine

## 2023-04-20 NOTE — Progress Notes (Unsigned)
 [name] [address] [place] [address] Phone: [phone] Subjective:   [name], am serving as a scribe for Dr. Antoine Primas.  I'm seeing this patient by the request  of:  [name], MD  CC: back pain follow up   BJY:NWGNFAOZHY  04/17/2023 Left side given with patient having better results with it previously.  Hopefully will make a big improvement again.     Post PRP injection and discussed icing regimen and home exercises.     Post PRP handout given.     Patient given injection and tolerated the procedure well, discussed icing regimen and home exercises, discussed avoiding the icing initially and the post PRP instructions.     Updated 04/25/2023 Kristine [name] is a 68 y.o. female coming in with complaint of PRP f/u. Doing manipulations today.  Patient states some tightness noted.  Nothing that stopping her from activity but does give her discomfort.  Tightness noted with [name] right greater than left.       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 26 (grandson [id number]) and 27 year old daughters  (grandson in late 2020) in 2016. Both live close.    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      Hobbies: read, walk dog, yoga, movies, steelers in the fall   Social Drivers of Corporate investment banker Strain: Not on file  Food Insecurity: No Food Insecurity (06/21/2022)   Hunger Vital Sign    Worried About Running Out of Food in the Last Year: Never true    Ran Out of Food in the Last Year: Never true  Transportation Needs: No Transportation Needs (06/21/2022)   PRAPARE - Administrator, Civil Service (Medical): No    Lack of Transportation (Non-Medical): No  Physical Activity: Sufficiently Active (06/21/2022)   Exercise Vital Sign    Days of Exercise per Week: 7 days    Minutes of Exercise per Session: 60 min  Stress: No Stress Concern Present (06/21/2022)   [name] of Occupational Health - Occupational Stress Questionnaire    Feeling of Stress : Only a little  Social Connections: Socially Integrated (06/21/2022)   Social Connection and Isolation Panel [NHANES]    Frequency of Communication with Friends and Family: More than three times a week    Frequency of Social Gatherings with Friends  and Family: Three times a week    Attends Religious Services: 1 to 4 times per year    Active Member of Clubs or Organizations: Yes    Attends Engineer, structural: More than 4 times per year    Marital Status: Married   No Known Allergies Family History  Problem Relation Age of Onset   Heart disease Father        54 MI- 4 stents incl. LAD, former smoker distant   Lung disease Mother        "shock lung"   Colon cancer Neg Hx    Colon polyps Neg Hx    Rectal cancer Neg Hx    Stomach cancer Neg Hx    Esophageal cancer Neg Hx     Current Outpatient Medications (Endocrine & Metabolic):    methimazole (TAPAZOLE) 10 MG tablet, TAKE 1/2 TABLET BY MOUTH DAILY   Current Outpatient Medications  (Respiratory):    albuterol (VENTOLIN HFA) 108 (90 Base) MCG/ACT inhaler, Inhale 2 puffs into the lungs every 4 (four) hours as needed for wheezing or shortness of breath.   diphenhydrAMINE (BENADRYL) 25 MG tablet, Take 25 mg by mouth daily.   fluticasone (FLONASE) 50 MCG/ACT nasal spray, PLACE 1 SPRAY INTO BOTH NOSTRILS 2 (TWO) TIMES DAILY   ipratropium (ATROVENT) 0.06 % nasal spray, SPRAY 1 SPRAY INTO BOTH NOSTRILS DAILY.   loratadine (CLARITIN) 10 MG tablet, Take 10 mg by mouth daily.    Current Outpatient Medications (Other):    Cholecalciferol (VITAMIN D3 PO), Take by mouth.   dorzolamide-timolol (COSOPT) 22.3-6.8 MG/ML ophthalmic solution, Place 1 drop into both eyes daily. Through optho   Misc Natural Products (TURMERIC CURCUMIN) CAPS, Take 1 capsule by mouth daily.   Multiple Vitamin (MULTIVITAMIN) tablet, Take 1 tablet by mouth daily.   Omega-3 Fatty Acids (FISH OIL) 500 MG CAPS, Take by mouth. 1200 mg daily   Tart Cherry 1200 MG CAPS,    Reviewed prior external information including notes and imaging from  primary care provider As well as notes that were available from care everywhere and other healthcare systems.  Past medical history, social, surgical and family history all reviewed in electronic medical record.  No pertanent information unless stated regarding to the chief complaint.   Review of Systems:  No headache, visual changes, nausea, vomiting, diarrhea, constipation, dizziness, abdominal pain, skin rash, fevers, chills, night sweats, weight loss, swollen lymph nodes, body aches, joint swelling, chest pain, shortness of breath, mood changes. POSITIVE muscle aches  Objective  Blood pressure 110/62, height 5\' 5"  (1.651 m), last menstrual period 03/31/2010.   General: No apparent distress alert and oriented x3 mood and affect normal, dressed appropriately.  HEENT: Pupils equal, extraocular movements intact  Respiratory: Patient's speak in full sentences and does not  appear short of breath  Cardiovascular: No lower extremity edema, non tender, no erythema  Back exam does have some loss lordosis noted.  Some tenderness to palpation noted.  Osteopathic findings C2 flexed rotated and side bent right C4 flexed rotated and side bent left C6 flexed rotated and side bent left T3 extended rotated and side bent right inhaled third rib T9 extended rotated and side bent right inhaled rib L2 flexed rotated and side bent right Sacrum right on right    Impression and Recommendations:    Slipped rib syndrome Slipped rib syndrome noted.  Discussed icing regimen and home exercises.  Continue to work on Financial controller.  Patient does respond well to this.  Does  have young grandchildren.  Discussed proper lifting technique.  Follow-up again in 6 to 8 weeks   Decision today to treat with OMT was based on Physical Exam  After verbal consent patient was treated with HVLA, ME, FPR techniques in cervical, thoracic, rib, lumbar and sacral areas, all areas are chronic   Patient tolerated the procedure well with improvement in symptoms  Patient given exercises, stretches and lifestyle modifications  See medications in patient instructions if given  Patient will follow up in 4-8 weeks  The above documentation has been reviewed and is accurate and complete Judi Saa, DO

## 2023-04-25 ENCOUNTER — Ambulatory Visit (INDEPENDENT_AMBULATORY_CARE_PROVIDER_SITE_OTHER): Payer: Managed Care, Other (non HMO) | Admitting: Family Medicine

## 2023-04-25 ENCOUNTER — Encounter: Payer: Self-pay | Admitting: Family Medicine

## 2023-04-25 VITALS — BP 110/62 | Ht 65.0 in

## 2023-04-25 DIAGNOSIS — M9901 Segmental and somatic dysfunction of cervical region: Secondary | ICD-10-CM | POA: Diagnosis not present

## 2023-04-25 DIAGNOSIS — M940 Chondrocostal junction syndrome [Tietze]: Secondary | ICD-10-CM | POA: Diagnosis not present

## 2023-04-25 DIAGNOSIS — M9908 Segmental and somatic dysfunction of rib cage: Secondary | ICD-10-CM | POA: Diagnosis not present

## 2023-04-25 DIAGNOSIS — M9902 Segmental and somatic dysfunction of thoracic region: Secondary | ICD-10-CM | POA: Diagnosis not present

## 2023-04-25 DIAGNOSIS — M9903 Segmental and somatic dysfunction of lumbar region: Secondary | ICD-10-CM | POA: Diagnosis not present

## 2023-04-25 DIAGNOSIS — M9904 Segmental and somatic dysfunction of sacral region: Secondary | ICD-10-CM | POA: Diagnosis not present

## 2023-04-25 NOTE — Patient Instructions (Signed)
 Good to see you! Have fun with grand babies Keep working on ergonomics and posture See you again at next appointment

## 2023-04-25 NOTE — Assessment & Plan Note (Signed)
 Slipped rib syndrome noted.  Discussed icing regimen and home exercises.  Continue to work on Financial controller.  Patient does respond well to this.  Does have young grandchildren.  Discussed proper lifting technique.  Follow-up again in 6 to 8 weeks

## 2023-06-06 NOTE — Progress Notes (Unsigned)
 [facility] [address] [place] [address] Phone: [phone] Subjective:   Kristine [name], am serving as a scribe for Dr. Ronnell Coins.  I'm seeing this patient by the request  of:  Almira Jaeger, MD  CC: Right hand follow-up left hand follow-up and supportive syndrome  BJY:NWGNFAOZHY  CHALIA MORUA is a 68 y.o. female coming in with complaint of back and neck pain. OMT on 04/25/2023. Patient states  that her hip is doing well.   L hand ganglion cyst near middle finger is still there. Also continues to have triggering in the R hand.   Has not been running but rode her bike 15 miles this weekend.   Medications patient has been prescribed:   Taking:         Reviewed prior external information including notes and imaging from previsou exam, outside providers and external EMR if available.   As well as notes that were available from care everywhere and other healthcare systems.  Past medical history, social, surgical and family history all reviewed in electronic medical record.  No pertanent information unless stated regarding to the chief complaint.   Past Medical History:  Diagnosis Date   Allergy    GRAVE'S DISEASE 01/24/2007    No Known Allergies   Review of Systems:  No headache, visual changes, nausea, vomiting, diarrhea, constipation, dizziness, abdominal pain, skin rash, fevers, chills, night sweats, weight loss, swollen lymph nodes, body aches, joint swelling, chest pain, shortness of breath, mood changes. POSITIVE muscle aches  Objective  Blood pressure 110/72, pulse (!) 57, height 5\' 5"  (1.651 m), weight 119 lb (54 kg), last menstrual period 03/31/2010, SpO2 98%.   General: No apparent distress alert and oriented x3 mood and affect normal, dressed appropriately.  HEENT: Pupils equal, extraocular movements intact  Respiratory: Patient's speak in full sentences and does not appear short of breath  Cardiovascular: No  lower extremity edema, non tender, no erythema  Gait relatively normal MSK:  Back does have some loss lordosis noted.  Some tenderness to palpation in the paraspinal musculature.  Tightness in the left parascapular area  Osteopathic findings C6 flexed rotated and side bent left T3 extended rotated and side bent left inhaled rib T9 extended rotated and side bent left   Procedure: Real-time Ultrasound Guided Injection of left palmar ganglion cyst Device: GE Logiq Q7 Ultrasound guided injection is preferred based studies that show increased duration, increased effect, greater accuracy, decreased procedural pain, increased response rate, and decreased cost with ultrasound guided versus blind injection.  Verbal informed consent obtained.  Time-out conducted.  Noted no overlying erythema, induration, or other signs of local infection.  Skin prepped in a sterile fashion.  Local anesthesia: Topical Ethyl chloride.  With sterile technique and under real time ultrasound guidance: With a 25-gauge half inch needle injected with 0.5 cc of 0.5% Marcaine and 0.5 cc of Kenalog  40 mg/mL Completed without difficulty  Pain immediately resolved suggesting accurate placement of the medication.  Advised to call if fevers/chills, erythema, induration, drainage, or persistent bleeding.  Impression: Technically successful ultrasound guided injection.    Assessment and Plan:  Ganglion cyst of finger of left hand Discussed icing regimen and home exercises, which activities to do and which ones to avoid.  Increase activity slowly over the course the next several weeks.  Discussed icing regimen.  Patient still would not want any type of surgical intervention.  Follow-up again in 6 to 8 weeks  Nonallopathic problems  Decision today to treat with OMT was based on Physical Exam  After verbal consent patient was treated with HVLA, ME, FPR techniques in cervical, rib, thoracic,areas  Patient tolerated the  procedure well with improvement in symptoms  Patient given exercises, stretches and lifestyle modifications  See medications in patient instructions if given  Patient will follow up in 4-8 weeks    The above documentation has been reviewed and is accurate and complete Ezariah Nace M Ivana Nicastro, DO          Note: This dictation was prepared with Dragon dictation along with smaller phrase technology. Any transcriptional errors that result from this process are unintentional.

## 2023-06-07 ENCOUNTER — Encounter: Payer: Self-pay | Admitting: Family Medicine

## 2023-06-07 ENCOUNTER — Ambulatory Visit (INDEPENDENT_AMBULATORY_CARE_PROVIDER_SITE_OTHER): Payer: PRIVATE HEALTH INSURANCE | Admitting: Family Medicine

## 2023-06-07 ENCOUNTER — Other Ambulatory Visit: Payer: Self-pay

## 2023-06-07 VITALS — BP 110/72 | HR 57 | Ht 65.0 in | Wt 119.0 lb

## 2023-06-07 DIAGNOSIS — M25551 Pain in right hip: Secondary | ICD-10-CM | POA: Diagnosis not present

## 2023-06-07 DIAGNOSIS — M67442 Ganglion, left hand: Secondary | ICD-10-CM | POA: Diagnosis not present

## 2023-06-07 DIAGNOSIS — M9908 Segmental and somatic dysfunction of rib cage: Secondary | ICD-10-CM | POA: Diagnosis not present

## 2023-06-07 DIAGNOSIS — M940 Chondrocostal junction syndrome [Tietze]: Secondary | ICD-10-CM

## 2023-06-07 DIAGNOSIS — M9901 Segmental and somatic dysfunction of cervical region: Secondary | ICD-10-CM | POA: Diagnosis not present

## 2023-06-07 DIAGNOSIS — M9902 Segmental and somatic dysfunction of thoracic region: Secondary | ICD-10-CM | POA: Diagnosis not present

## 2023-06-07 NOTE — Assessment & Plan Note (Signed)
 Discussed icing regimen and home exercises, which activities to do and which ones to avoid.  Increase activity slowly over the course the next several weeks.  Discussed icing regimen.  Patient still would not want any type of surgical intervention.  Follow-up again in 6 to 8 weeks

## 2023-06-07 NOTE — Assessment & Plan Note (Signed)
 Tightness still noted.  Discussed with patient traveling would like to see her again in 8 weeks.  Will continue to stay active otherwise.  Discussed icing regimen.  Follow-up again in 6 to 8 weeks

## 2023-06-07 NOTE — Patient Instructions (Addendum)
 Injected L hand Enjoy [employer] See me again in 8 weeks

## 2023-07-21 NOTE — Progress Notes (Unsigned)
  [facility] [address] [place] [address] Phone: [phone] Subjective:   Delwyn Filippo, am serving as a scribe for Dr. Ronnell Coins.  I'm seeing this patient by the request  of:  Almira Jaeger, MD  CC: Back and neck pain follow-up  [id number]  ECKO BEASLEY is a 68 y.o. female coming in with complaint of back and neck pain. OMT on 05/29/2023. Wrist pain as well. Patient states that she has been doing well. PRP for ring finger has reduced pain. Still has triggering. Thumb also feels good. Hip pain is the best it has been in years. Running will cause soreness.   Knots in scapula on both sides.   Medications patient has been prescribed:   Taking:         Reviewed prior external information including notes and imaging from previsou exam, outside providers and external EMR if available.   As well as notes that were available from care everywhere and other healthcare systems.  Past medical history, social, surgical and family history all reviewed in electronic medical record.  No pertanent information unless stated regarding to the chief complaint.   Past Medical History:  Diagnosis Date   Allergy    GRAVE'S DISEASE 01/24/2007    No Known Allergies   Review of Systems:  No headache, visual changes, nausea, vomiting, diarrhea, constipation, dizziness, abdominal pain, skin rash, fevers, chills, night sweats, weight loss, swollen lymph nodes, body aches, joint swelling, chest pain, shortness of breath, mood changes. POSITIVE muscle aches  Objective  Blood pressure 104/72, pulse 67, height 5' 5 (1.651 m), weight 120 lb (54.4 kg), last menstrual period 03/31/2010, SpO2 97%.   General: No apparent distress alert and oriented x3 mood and affect normal, dressed appropriately.  HEENT: Pupils equal, extraocular movements intact  Respiratory: Patient's speak in full sentences and does not appear short of breath  Cardiovascular: No  lower extremity edema, non tender, no erythema  Gait relatively normal. MSK:  Back does have some loss lordosis.  Some tenderness to palpation in the paraspinal musculature.  Tightness more in the parascapular area right greater than left.  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  Did respond extremely well to osteopathic manipulation.  Discussed icing regimen of home exercises, increase activity slowly.  Follow-up again in 6 to 8 weeks.  No other changes.    Nonallopathic problems  Decision today to treat with OMT was based on Physical Exam  After verbal consent patient was treated with HVLA, ME, FPR techniques in cervical, rib, thoracic, lumbar, and sacral  areas  Patient tolerated the procedure well with improvement in symptoms  Patient given exercises, stretches and lifestyle modifications  See medications in patient instructions if given  Patient will follow up in 4-8 weeks    The above documentation has been reviewed and is accurate and complete Yousef Huge M Dontarious Schaum, DO          Note: This dictation was prepared with Dragon dictation along with smaller phrase technology. Any transcriptional errors that result from this process are unintentional.

## 2023-07-24 ENCOUNTER — Other Ambulatory Visit: Payer: Self-pay

## 2023-07-24 ENCOUNTER — Ambulatory Visit (INDEPENDENT_AMBULATORY_CARE_PROVIDER_SITE_OTHER): Admitting: Family Medicine

## 2023-07-24 ENCOUNTER — Encounter: Payer: Self-pay | Admitting: Family Medicine

## 2023-07-24 VITALS — BP 104/72 | HR 67 | Ht 65.0 in | Wt 120.0 lb

## 2023-07-24 DIAGNOSIS — M9901 Segmental and somatic dysfunction of cervical region: Secondary | ICD-10-CM | POA: Diagnosis not present

## 2023-07-24 DIAGNOSIS — M79642 Pain in left hand: Secondary | ICD-10-CM | POA: Diagnosis not present

## 2023-07-24 DIAGNOSIS — M9902 Segmental and somatic dysfunction of thoracic region: Secondary | ICD-10-CM | POA: Diagnosis not present

## 2023-07-24 DIAGNOSIS — M9903 Segmental and somatic dysfunction of lumbar region: Secondary | ICD-10-CM | POA: Diagnosis not present

## 2023-07-24 DIAGNOSIS — M9904 Segmental and somatic dysfunction of sacral region: Secondary | ICD-10-CM

## 2023-07-24 DIAGNOSIS — M940 Chondrocostal junction syndrome [Tietze]: Secondary | ICD-10-CM | POA: Diagnosis not present

## 2023-07-24 DIAGNOSIS — M9908 Segmental and somatic dysfunction of rib cage: Secondary | ICD-10-CM

## 2023-07-24 NOTE — Assessment & Plan Note (Signed)
 Likely selective exacerbation.  Did respond extremely well to osteopathic manipulation.  Discussed icing regimen of home exercises, increase activity slowly.  Follow-up again in 6 to 8 weeks.  No other changes.

## 2023-07-24 NOTE — Patient Instructions (Signed)
 Yoga wheel  Lacrosse  ball Cookbook holder Floppy hat with 2 squirrels See you again in 2-3 months

## 2023-08-17 ENCOUNTER — Telehealth: Payer: Self-pay

## 2023-08-17 ENCOUNTER — Ambulatory Visit (INDEPENDENT_AMBULATORY_CARE_PROVIDER_SITE_OTHER): Payer: Managed Care, Other (non HMO) | Admitting: Family Medicine

## 2023-08-17 VITALS — BP 100/70 | HR 85 | Temp 98.1°F | Ht 65.0 in | Wt 119.4 lb

## 2023-08-17 DIAGNOSIS — E785 Hyperlipidemia, unspecified: Secondary | ICD-10-CM | POA: Diagnosis not present

## 2023-08-17 DIAGNOSIS — E0500 Thyrotoxicosis with diffuse goiter without thyrotoxic crisis or storm: Secondary | ICD-10-CM

## 2023-08-17 DIAGNOSIS — Z0000 Encounter for general adult medical examination without abnormal findings: Secondary | ICD-10-CM | POA: Diagnosis not present

## 2023-08-17 NOTE — Patient Instructions (Addendum)
 We are requesting pap and mammogram records  Send me the date from pharmacy from Prevnar 20/pneumonia shot  Kristine [name] , Thank you for taking time to come for your Medicare Wellness Visit. I appreciate your ongoing commitment to your health goals. Please review the following plan we discussed and let me know if I can assist you in the future.   These are the goals we discussed: Continue excellent work with exercise  Nurse visit for EKG on future date with EKG system down- you will not be charged for this/D[place]ger should write off if any issues- order is in as future- ill send you interpretation   This is a list of the screening recommended for you and due dates:  Health Maintenance  Topic Date Due   Pneumococcal Vaccine for age over 35 (1 of 1 - PCV) Never done   Mammogram  10/24/2022   Pap with HPV screening  02/14/2023   COVID-19 Vaccine (8 - 2024-25 season) 09/02/2023*   Flu Shot  09/08/2023   Colon Cancer Screening  08/14/2024   Medicare Annual Wellness Visit  08/16/2024   DTaP/Tdap/Td vaccine (3 - Td or Tdap) 02/08/2026   DEXA scan (bone density measurement)  03/24/2029   Hepatitis C Screening  Completed   Zoster (Shingles) Vaccine  Completed   HIV Screening  Addressed   Hepatitis B Vaccine  Aged Out   HPV Vaccine  Aged Out   Meningitis B Vaccine  Aged Out  *Topic was postponed. The date shown is not the original due date.    Recommended follow up: Return in about 1 year (around 08/16/2024) for followup or sooner if needed.Schedule b4 you leave.
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
## 2023-08-18 ENCOUNTER — Ambulatory Visit: Payer: Self-pay | Admitting: Family Medicine

## 2023-08-18 LAB — LIPID PANEL
Cholesterol: 208 mg/dL — ABNORMAL HIGH (ref 0–200)
HDL: 71.3 mg/dL (ref >39.00)
LDL Cholesterol: 124 mg/dL — ABNORMAL HIGH (ref 0–99)
NonHDL: 137.11
Total CHOL/HDL Ratio: 3
Triglycerides: 65 mg/dL (ref 0.0–149.0)
VLDL: 13 mg/dL (ref 0.0–40.0)

## 2023-08-18 LAB — COMPREHENSIVE METABOLIC PANEL WITH GFR
ALT: 18 U/L (ref 0–35)
AST: 27 U/L (ref 0–37)
Albumin: 4.5 g/dL (ref 3.5–5.2)
Alkaline Phosphatase: 61 U/L (ref 39–117)
BUN: 8 mg/dL (ref 6–23)
CO2: 29 meq/L (ref 19–32)
Calcium: 9.7 mg/dL (ref 8.4–10.5)
Chloride: 105 meq/L (ref 96–112)
Creatinine, Ser: 0.77 mg/dL (ref 0.40–1.20)
GFR: 80.84 mL/min (ref >60.00)
Glucose, Bld: 77 mg/dL (ref 70–99)
Potassium: 4.3 meq/L (ref 3.5–5.1)
Sodium: 140 meq/L (ref 135–145)
Total Bilirubin: 0.8 mg/dL (ref 0.2–1.2)
Total Protein: 6.8 g/dL (ref 6.0–8.3)

## 2023-08-18 LAB — CBC WITH DIFFERENTIAL/PLATELET
Basophils Absolute: 0.1 K/uL (ref 0.0–0.1)
Basophils Relative: 0.9 % (ref 0.0–3.0)
Eosinophils Absolute: 0.1 K/uL (ref 0.0–0.7)
Eosinophils Relative: 1.6 % (ref 0.0–5.0)
HCT: 42.9 % (ref 36.0–46.0)
Hemoglobin: 14.5 g/dL (ref 12.0–15.0)
Lymphocytes Relative: 33.1 % (ref 12.0–46.0)
Lymphs Abs: 2.3 K/uL (ref 0.7–4.0)
MCHC: 33.7 g/dL (ref 30.0–36.0)
MCV: 93.5 fl (ref 78.0–100.0)
Monocytes Absolute: 0.5 K/uL (ref 0.1–1.0)
Monocytes Relative: 7.2 % (ref 3.0–12.0)
Neutro Abs: 3.9 K/uL (ref 1.4–7.7)
Neutrophils Relative %: 57.2 % (ref 43.0–77.0)
Platelets: 234 K/uL (ref 150.0–400.0)
RBC: 4.59 Mil/uL (ref 3.87–5.11)
RDW: 13.1 % (ref 11.5–15.5)
WBC: 6.8 K/uL (ref 4.0–10.5)

## 2023-08-18 LAB — T3, FREE: T3, Free: 2.7 pg/mL (ref 2.3–4.2)

## 2023-08-18 LAB — TSH: TSH: 1.65 u[IU]/mL (ref 0.35–5.50)

## 2023-08-18 LAB — T4, FREE: Free T4: 0.86 ng/dL (ref 0.60–1.60)

## 2023-08-18 NOTE — Telephone Encounter (Signed)
 EKG was down on 08/17/2023 pt will need to come back for nurse visit for EKG for her when is back up and running.

## 2023-08-21 ENCOUNTER — Encounter: Payer: Self-pay | Admitting: Family Medicine

## 2023-10-05 NOTE — Progress Notes (Deleted)
  [name] [address] [place] [address] Phone: [phone] Subjective:    I'm seeing this patient by the request  of:  Katrinka Garnette KIDD, MD  CC:   YEP:[id number]  Kristine [name] is a 68 y.o. female coming in with complaint of back and neck pain. OMT on 07/24/2023. Patient states   Medications patient has been prescribed:   Taking:         Reviewed prior external information including notes and imaging from previsou exam, outside providers and external EMR if available.   As well as notes that were available from care everywhere and other healthcare systems.  Past medical history, social, surgical and family history all reviewed in electronic medical record.  No pertanent information unless stated regarding to the chief complaint.   Past Medical History:  Diagnosis Date   Allergy    Glaucoma 05/08/2016   Dr. Alm Pinal diagnosed; could be compl of Graves Disease   GRAVE'S DISEASE 01/24/2007    No Known Allergies   Review of Systems:  No headache, visual changes, nausea, vomiting, diarrhea, constipation, dizziness, abdominal pain, skin rash, fevers, chills, night sweats, weight loss, swollen lymph nodes, body aches, joint swelling, chest pain, shortness of breath, mood changes. POSITIVE muscle aches  Objective  Last menstrual period 03/31/2010.   General: No apparent distress alert and oriented x3 mood and affect normal, dressed appropriately.  HEENT: Pupils equal, extraocular movements intact  Respiratory: Patient's speak in full sentences and does not appear short of breath  Cardiovascular: No lower extremity edema, non tender, no erythema  Gait MSK:  Back   Osteopathic findings  C2 flexed rotated and side bent right C6 flexed rotated and side bent left T3 extended rotated and side bent right inhaled rib T9 extended rotated and side bent left L2 flexed rotated and side bent right Sacrum right on right        Assessment and Plan:  No problem-specific Assessment & Plan notes found for this encounter.    Nonallopathic problems  Decision today to treat with OMT was based on Physical Exam  After verbal consent patient was treated with HVLA, ME, FPR techniques in cervical, rib, thoracic, lumbar, and sacral  areas  Patient tolerated the procedure well with improvement in symptoms  Patient given exercises, stretches and lifestyle modifications  See medications in patient instructions if given  Patient will follow up in 4-8 weeks             Note: This dictation was prepared with Dragon dictation along with smaller phrase technology. Any transcriptional errors that result from this process are unintentional.

## 2023-10-09 ENCOUNTER — Other Ambulatory Visit: Payer: Self-pay | Admitting: Internal Medicine

## 2023-10-09 DIAGNOSIS — J208 Acute bronchitis due to other specified organisms: Secondary | ICD-10-CM | POA: Insufficient documentation

## 2023-10-09 MED ORDER — NIRMATRELVIR/RITONAVIR (PAXLOVID)TABLET: 3.0000 | ORAL_TABLET | Freq: Two times a day (BID) | ORAL | 0 refills | Status: AC

## 2023-10-10 ENCOUNTER — Encounter: Payer: Self-pay | Admitting: Family Medicine

## 2023-10-10 ENCOUNTER — Telehealth: Payer: Self-pay | Admitting: Family Medicine

## 2023-10-10 NOTE — Telephone Encounter (Signed)
 Covid+ / RX was sent in  Patient Name First: Kristine Last: [name] Gender: Female DOB: Dec 03, 1955 Age: 68 Y 90 M 2 D Return Phone Number: [phone] (Primary) Address: City/ State/ Zip: [place]  [address] Client [facility] - Human resources officer [facility] [employer] Provider Katrinka Senior- MD Contact Type Call Who Is Calling Patient / Member / Family / Caregiver Call Type Triage / Clinical Relationship To Patient Self Return Phone Number [phone] (Primary) Chief Complaint Prescription Refill or Medication Request (non symptomatic) Reason for Call Medication Question / Request Initial Comment Caller states she is positive for Covid and would like to have an Rx sent to her pharmacy. Translation No Nurse Assessment Nurse: Cox, RN, Eileen Date/Time (Eastern Time): 10/09/2023 8:45:01 AM Confirm and document reason for call. If symptomatic, describe symptoms. ---Caller states she would like COVID medication sent to her pharmacy. Pt has a lot of congestion in her head and her chest, no fever. No difficulty breathing. Does the patient have any new or worsening symptoms? ---Yes Will a triage be completed? ---Yes Related visit to physician within the last 2 weeks? ---No Does the PT have any chronic conditions? (i.e. diabetes, asthma, this includes High risk factors for pregnancy, etc.) ---Yes List chronic conditions. ---graves disease and glaucoma Is this a behavioral health or substance abuse call? ---No Guidelines Guideline Title Affirmed Question Affirmed Notes Nurse Date/Time (Eastern Time) COVID-19 - Diagnosed or Suspected [1] HIGH RISK patient (e.g., weak immune system, 65 years and older, obesity with BMI 30 or higher, pregnant, chronic lung disease) AND [2] COVID Cox, RN, Eileen 10/09/2023 8:47:41 AM Guidelines Guideline Title Affirmed Question Affirmed Notes Nurse Date/Time  Titus Time) symptoms (e.g., cough, fever) (Exceptions: Already seen by PCP and no new or worsening symptoms.) Disp. Time Titus Time) Disposition Final User 10/09/2023 8:52:17 AM Call PCP within 24 Hours Yes Cox, RN, Eileen 10/09/2023 9:01:53 AM Paged On Call back to Call Center Cox, RN, [place] Final Disposition 10/09/2023 8:52:17 AM Call PCP within 24 Hours Yes Cox, RN, Eileen Caller Disagree/Comply Comply Caller Understands Yes PreDisposition Go to Urgent Care/Walk-In Clinic Care Advice Given Per Guideline CALL PCP WITHIN 24 HOURS: CALL BACK IF: * You become worse Comments User: Eileen Free, RN Date/Time (Eastern Time): 10/09/2023 8:48:41 AM pt states she had chills last night, her husband tested positive on Saturday. User: Eileen Free, RN Date/Time Titus Time): 10/09/2023 9:09:19 AM Spoke to the pt she is aware of the update Paging DoctorName Phone DateTime Result/ Outcome Message Type Notes Joshua Ned - MD [phone] 10/09/2023 9:01:53 AM Paged On Call Back to Call Center Doctor Paged Reaching based on a pt you have on call. Eileen [phone] Joshua Ned - MD 10/09/2023 9:08:01 AM Spoke with On Call - General Message Result On call will send the medication in about 30 mins to her local pharmacy.

## 2023-10-11 ENCOUNTER — Ambulatory Visit: Admitting: Family Medicine

## 2023-11-15 LAB — HM MAMMOGRAPHY

## 2023-11-15 NOTE — Progress Notes (Unsigned)
 [name] [address] [place] [address] Phone: [phone] Subjective:   Kristine [name], am serving as a scribe for Dr. Arthea Claudene.  I'm seeing this patient by the request  of:  Katrinka Garnette KIDD, MD  CC: Back and neck pain  YEP:Dlagzrupcz  Kristine [name] is a 68 y.o. female coming in with complaint of back and neck pain. OMT on 07/24/2023. Patient states that she had COVID a month ago. Hips and finger are feeling good. No pain in finger but it sometimes triggers 2x a day.   Using yoga wheel and LAX ball at night which has been very helpful.   Patient is c/o pain in back of both shoulders, L>R. Unsure if this is due to the increase in training over the summer. No pain with spinning or running. Would like albuterol  refilled.     Medications patient has been prescribed:   Taking:         Reviewed prior external information including notes and imaging from previsou exam, outside providers and external EMR if available.   As well as notes that were available from care everywhere and other healthcare systems.  Past medical history, social, surgical and family history all reviewed in electronic medical record.  No pertanent information unless stated regarding to the chief complaint.   Past Medical History:  Diagnosis Date   Allergy    Glaucoma 05/08/2016   Dr. Alm Pinal diagnosed; could be compl of Graves Disease   GRAVE'S DISEASE 01/24/2007    No Known Allergies   Review of Systems:  No headache, visual changes, nausea, vomiting, diarrhea, constipation, dizziness, abdominal pain, skin rash, fevers, chills, night sweats, weight loss, swollen lymph nodes, body aches, joint swelling, chest pain, shortness of breath, mood changes. POSITIVE muscle aches  Objective  Blood pressure 110/72, pulse (!) 55, height 5' 5 (1.651 m), weight 130 lb (59 kg), last menstrual period 03/31/2010, SpO2 98%.   General: No apparent distress alert  and oriented x3 mood and affect normal, dressed appropriately.  HEENT: Pupils equal, extraocular movements intact  Respiratory: Patient's speak in full sentences and does not appear short of breath  Cardiovascular: No lower extremity edema, non tender, no erythema  Gait relatively normal MSK:  Back back exam does have some loss lordosis.  Tightness noted in the parascapular area left greater than right.  Osteopathic findings  C2 flexed rotated and side bent right C7 flexed rotated and side bent right T3 extended rotated and side bent right inhaled rib T9 extended rotated and side bent left L2 flexed rotated and side bent right L3 flexed rotated and side bent left Sacrum right on right       Assessment and Plan:  Slipped rib syndrome Concern with this as well as patient having some cervical radiculopathy.  We discussed with patient posture and ergonomics, discussed which activities to do and which ones to avoid.  Would like x-rays of the cervical spine to see what type of radicular symptoms could be contributing.  Discussed icing regimen and home exercises, discussed which activities to do and which ones to avoid.  Increase activity slowly.    Nonallopathic problems  Decision today to treat with OMT was based on Physical Exam  After verbal consent patient was treated with HVLA, ME, FPR techniques in cervical, rib, thoracic, lumbar, and sacral  areas  Patient tolerated the procedure well with improvement in symptoms  Patient given exercises, stretches and lifestyle modifications  See  medications in patient instructions if given  Patient will follow up in 4-8 weeks    The above documentation has been reviewed and is accurate and complete Arthea CHRISTELLA Sharps, DO          Note: This dictation was prepared with Dragon dictation along with smaller phrase technology. Any transcriptional errors that result from this process are unintentional.

## 2023-11-16 ENCOUNTER — Encounter: Payer: Self-pay | Admitting: Family Medicine

## 2023-11-16 ENCOUNTER — Ambulatory Visit

## 2023-11-16 ENCOUNTER — Ambulatory Visit (INDEPENDENT_AMBULATORY_CARE_PROVIDER_SITE_OTHER): Admitting: Family Medicine

## 2023-11-16 ENCOUNTER — Other Ambulatory Visit: Payer: Self-pay

## 2023-11-16 VITALS — BP 110/72 | HR 55 | Ht 65.0 in | Wt 130.0 lb

## 2023-11-16 DIAGNOSIS — M9902 Segmental and somatic dysfunction of thoracic region: Secondary | ICD-10-CM

## 2023-11-16 DIAGNOSIS — M9903 Segmental and somatic dysfunction of lumbar region: Secondary | ICD-10-CM

## 2023-11-16 DIAGNOSIS — M9908 Segmental and somatic dysfunction of rib cage: Secondary | ICD-10-CM

## 2023-11-16 DIAGNOSIS — M940 Chondrocostal junction syndrome [Tietze]: Secondary | ICD-10-CM

## 2023-11-16 DIAGNOSIS — M9904 Segmental and somatic dysfunction of sacral region: Secondary | ICD-10-CM | POA: Diagnosis not present

## 2023-11-16 DIAGNOSIS — M9901 Segmental and somatic dysfunction of cervical region: Secondary | ICD-10-CM

## 2023-11-16 DIAGNOSIS — M542 Cervicalgia: Secondary | ICD-10-CM

## 2023-11-16 MED ORDER — ALBUTEROL SULFATE HFA 108 (90 BASE) MCG/ACT IN AERS: 2.0000 | INHALATION_SPRAY | RESPIRATORY_TRACT | 6 refills | Status: AC | PRN

## 2023-11-16 NOTE — Assessment & Plan Note (Signed)
 Concern with this as well as patient having some cervical radiculopathy.  We discussed with patient posture and ergonomics, discussed which activities to do and which ones to avoid.  Would like x-rays of the cervical spine to see what type of radicular symptoms could be contributing.  Discussed icing regimen and home exercises, discussed which activities to do and which ones to avoid.  Increase activity slowly.

## 2023-11-16 NOTE — Patient Instructions (Signed)
 Xray today Scap exercises See me in 8 weeks

## 2023-11-22 ENCOUNTER — Ambulatory Visit: Payer: Self-pay | Admitting: Family Medicine

## 2023-12-12 ENCOUNTER — Telehealth: Payer: Self-pay | Admitting: Family Medicine

## 2023-12-12 ENCOUNTER — Other Ambulatory Visit: Payer: Self-pay | Admitting: Family Medicine

## 2023-12-12 DIAGNOSIS — Z0000 Encounter for general adult medical examination without abnormal findings: Secondary | ICD-10-CM

## 2023-12-12 NOTE — Telephone Encounter (Signed)
 Patient scheduled for welcome to medicare EKG.    Copied from CRM #[id number]. Topic: Appointments - Scheduling Inquiry for Clinic >> Dec 12, 2023  2:53 PM Frederich PARAS wrote: Reason for CRM: pt calling in to schedule he EKG 12 lead, pt would like to be scheduled. Did not see any orders in labs tab for pt. Tried reaching out to [facility] unavailable.

## 2023-12-13 ENCOUNTER — Ambulatory Visit: Payer: Self-pay | Admitting: Family Medicine

## 2023-12-13 ENCOUNTER — Ambulatory Visit

## 2023-12-13 DIAGNOSIS — Z0000 Encounter for general adult medical examination without abnormal findings: Secondary | ICD-10-CM | POA: Diagnosis not present

## 2023-12-13 NOTE — Progress Notes (Signed)
 Patient is in office today for a nurse visit for EKG for her Welcome to [employer]. Patient successfully completed her EKG without an issue. Her printed EKG report was hand delivered to Dr. Katrinka by me. Dr. Katrinka verbalized that he would connect with patient via telephone or MyChart. Patient was notified to check MyChart and calls. Patient verbalized understanding before leaving for the day.

## 2023-12-21 ENCOUNTER — Other Ambulatory Visit: Payer: Self-pay | Admitting: Family Medicine

## 2024-01-12 NOTE — Progress Notes (Unsigned)
  [name] [address] [place] [address] Phone: [phone] Subjective:    I'm seeing this patient by the request  of:  Katrinka Garnette KIDD, MD  CC:   YEP:Dlagzrupcz  Kristine [name] is a 68 y.o. female coming in with complaint of back and neck pain. OMT on 11/16/2023. Patient states   Medications patient has been prescribed: albuterol   Taking:         Reviewed prior external information including notes and imaging from previsou exam, outside providers and external EMR if available.   As well as notes that were available from care everywhere and other healthcare systems.  Past medical history, social, surgical and family history all reviewed in electronic medical record.  No pertanent information unless stated regarding to the chief complaint.   Past Medical History:  Diagnosis Date   Allergy    Glaucoma 05/08/2016   Dr. Alm Pinal diagnosed; could be compl of Graves Disease   GRAVE'S DISEASE 01/24/2007    No Known Allergies   Review of Systems:  No headache, visual changes, nausea, vomiting, diarrhea, constipation, dizziness, abdominal pain, skin rash, fevers, chills, night sweats, weight loss, swollen lymph nodes, body aches, joint swelling, chest pain, shortness of breath, mood changes. POSITIVE muscle aches  Objective  Last menstrual period 03/31/2010.   General: No apparent distress alert and oriented x3 mood and affect normal, dressed appropriately.  HEENT: Pupils equal, extraocular movements intact  Respiratory: Patient's speak in full sentences and does not appear short of breath  Cardiovascular: No lower extremity edema, non tender, no erythema  Gait MSK:  Back   Osteopathic findings  C2 flexed rotated and side bent right C6 flexed rotated and side bent left T3 extended rotated and side bent right inhaled rib T9 extended rotated and side bent left L2 flexed rotated and side bent right Sacrum right on  right       Assessment and Plan:  No problem-specific Assessment & Plan notes found for this encounter.    Nonallopathic problems  Decision today to treat with OMT was based on Physical Exam  After verbal consent patient was treated with HVLA, ME, FPR techniques in cervical, rib, thoracic, lumbar, and sacral  areas  Patient tolerated the procedure well with improvement in symptoms  Patient given exercises, stretches and lifestyle modifications  See medications in patient instructions if given  Patient will follow up in 4-8 weeks             Note: This dictation was prepared with Dragon dictation along with smaller phrase technology. Any transcriptional errors that result from this process are unintentional.

## 2024-01-17 ENCOUNTER — Encounter: Payer: Self-pay | Admitting: Family Medicine

## 2024-01-17 ENCOUNTER — Ambulatory Visit (INDEPENDENT_AMBULATORY_CARE_PROVIDER_SITE_OTHER): Admitting: Family Medicine

## 2024-01-17 VITALS — BP 108/64 | HR 49 | Ht 65.0 in

## 2024-01-17 DIAGNOSIS — M25512 Pain in left shoulder: Secondary | ICD-10-CM | POA: Diagnosis not present

## 2024-01-17 DIAGNOSIS — M940 Chondrocostal junction syndrome [Tietze]: Secondary | ICD-10-CM

## 2024-01-17 DIAGNOSIS — M1811 Unilateral primary osteoarthritis of first carpometacarpal joint, right hand: Secondary | ICD-10-CM

## 2024-01-17 DIAGNOSIS — G8929 Other chronic pain: Secondary | ICD-10-CM

## 2024-01-17 DIAGNOSIS — M9908 Segmental and somatic dysfunction of rib cage: Secondary | ICD-10-CM

## 2024-01-17 DIAGNOSIS — M9902 Segmental and somatic dysfunction of thoracic region: Secondary | ICD-10-CM

## 2024-01-17 DIAGNOSIS — M9901 Segmental and somatic dysfunction of cervical region: Secondary | ICD-10-CM | POA: Diagnosis not present

## 2024-01-17 MED ORDER — CELECOXIB 100 MG PO CAPS: 100.0000 mg | ORAL_CAPSULE | Freq: Two times a day (BID) | ORAL | 0 refills | Status: DC

## 2024-01-17 NOTE — Patient Instructions (Addendum)
 Celebrex 100mg  BID Take it in 5 day bursts Enjoy the grandchild See me in 7 weeks

## 2024-01-17 NOTE — Assessment & Plan Note (Signed)
 Celebrex 100 mg twice a day given.  Discussed potential side effects.  Hopeful that this will make improvement.  Patient is concerned because of some side effects she had with the meloxicam  previously.  Will see how patient responds.

## 2024-01-17 NOTE — Assessment & Plan Note (Signed)
 Secondary to picking up granddaughter.  Discussed potential injection.  Will see how the Celebrex 100 mg twice a day helps.

## 2024-01-17 NOTE — Assessment & Plan Note (Signed)
 Responded extremely well though to the by the manipulation after further evaluation today.  Patient is going to start to increase activity as tolerated more.  Patient will be having more of a high stressful time.  And will be sitting more than she usually is.  Will see how patient responds.

## 2024-03-05 NOTE — Progress Notes (Unsigned)
" °  [name] [address] [place] [address] Phone: [phone] Subjective:    I'm seeing this patient by the request  of:  Katrinka Garnette KIDD, MD  CC: Back and neck pain follow-up  YEP:Dlagzrupcz  Kristine [name] is a 69 y.o. female coming in with complaint of back and neck pain. OMT on 01/17/2024. Patient states   Medications patient has been prescribed: Celebrex   Taking:         Reviewed prior external information including notes and imaging from previsou exam, outside providers and external EMR if available.   As well as notes that were available from care everywhere and other healthcare systems.  Past medical history, social, surgical and family history all reviewed in electronic medical record.  No pertanent information unless stated regarding to the chief complaint.   Past Medical History:  Diagnosis Date   Allergy    Glaucoma 05/08/2016   Dr. Alm Pinal diagnosed; could be compl of Graves Disease   GRAVE'S DISEASE 01/24/2007    Allergies[1]   Review of Systems:  No headache, visual changes, nausea, vomiting, diarrhea, constipation, dizziness, abdominal pain, skin rash, fevers, chills, night sweats, weight loss, swollen lymph nodes, body aches, joint swelling, chest pain, shortness of breath, mood changes. POSITIVE muscle aches  Objective  Last menstrual period 03/31/2010.   General: No apparent distress alert and oriented x3 mood and affect normal, dressed appropriately.  HEENT: Pupils equal, extraocular movements intact  Respiratory: Patient's speak in full sentences and does not appear short of breath  Cardiovascular: No lower extremity edema, non tender, no erythema  Gait MSK:  Back   Osteopathic findings  C2 flexed rotated and side bent right C6 flexed rotated and side bent left T3 extended rotated and side bent right inhaled rib T9 extended rotated and side bent left L2 flexed rotated and side bent  right Sacrum right on right       Assessment and Plan:  No problem-specific Assessment & Plan notes found for this encounter.    Nonallopathic problems  Decision today to treat with OMT was based on Physical Exam  After verbal consent patient was treated with HVLA, ME, FPR techniques in cervical, rib, thoracic, lumbar, and sacral  areas  Patient tolerated the procedure well with improvement in symptoms  Patient given exercises, stretches and lifestyle modifications  See medications in patient instructions if given  Patient will follow up in 4-8 weeks     The above documentation has been reviewed and is accurate and complete Tipton Ballow M Stori Royse, DO         Note: This dictation was prepared with Dragon dictation along with smaller phrase technology. Any transcriptional errors that result from this process are unintentional.            [1] No Known Allergies  "

## 2024-03-06 ENCOUNTER — Ambulatory Visit: Admitting: Family Medicine

## 2024-03-07 ENCOUNTER — Other Ambulatory Visit: Payer: Self-pay

## 2024-03-07 ENCOUNTER — Ambulatory Visit: Admitting: Family Medicine

## 2024-03-07 ENCOUNTER — Encounter: Payer: Self-pay | Admitting: Family Medicine

## 2024-03-07 VITALS — BP 110/70 | HR 94 | Ht 65.0 in | Wt 121.0 lb

## 2024-03-07 DIAGNOSIS — M940 Chondrocostal junction syndrome [Tietze]: Secondary | ICD-10-CM | POA: Diagnosis not present

## 2024-03-07 DIAGNOSIS — M9903 Segmental and somatic dysfunction of lumbar region: Secondary | ICD-10-CM | POA: Diagnosis not present

## 2024-03-07 DIAGNOSIS — M1811 Unilateral primary osteoarthritis of first carpometacarpal joint, right hand: Secondary | ICD-10-CM | POA: Diagnosis not present

## 2024-03-07 DIAGNOSIS — M9908 Segmental and somatic dysfunction of rib cage: Secondary | ICD-10-CM

## 2024-03-07 DIAGNOSIS — M9901 Segmental and somatic dysfunction of cervical region: Secondary | ICD-10-CM

## 2024-03-07 DIAGNOSIS — M25551 Pain in right hip: Secondary | ICD-10-CM

## 2024-03-07 DIAGNOSIS — M67442 Ganglion, left hand: Secondary | ICD-10-CM

## 2024-03-07 DIAGNOSIS — M9902 Segmental and somatic dysfunction of thoracic region: Secondary | ICD-10-CM | POA: Diagnosis not present

## 2024-03-07 DIAGNOSIS — M9904 Segmental and somatic dysfunction of sacral region: Secondary | ICD-10-CM

## 2024-03-07 MED ORDER — CELECOXIB 100 MG PO CAPS: 100.0000 mg | ORAL_CAPSULE | Freq: Two times a day (BID) | ORAL | 0 refills | Status: AC

## 2024-03-07 NOTE — Assessment & Plan Note (Signed)
 Patient did have the injection again.  Has had difficulty with a trigger finger as well and hoping that this would make a significant improvement.  Discussed icing regimen and home exercises, which activities to do and which ones to avoid.  Bracing at night if needed.  Follow-up again in 8 to 12 weeks

## 2024-03-07 NOTE — Progress Notes (Signed)
 " [name] [address] [place] [address] Phone: [phone] Subjective:   ISusannah [name], am serving as a scribe for Dr. Arthea Claudene.  I'm seeing this patient by the request  of:  Katrinka Garnette KIDD, MD  CC: Back and neck pain follow-up  Kristine [name]  GRISSEL TYRELL is a 69 y.o. female coming in with complaint of back and neck pain. OMT on 01/17/2024. Patient states doing well. Wanted to talk to you about hands. No other symptoms.  Medications patient has been prescribed: Celebrex   Taking:         Reviewed prior external information including notes and imaging from previsou exam, outside providers and external EMR if available.   As well as notes that were available from care everywhere and other healthcare systems.  Past medical history, social, surgical and family history all reviewed in electronic medical record.  No pertanent information unless stated regarding to the chief complaint.   Past Medical History:  Diagnosis Date   Allergy    Glaucoma 05/08/2016   Dr. Alm Pinal diagnosed; could be compl of Graves Disease   GRAVE'S DISEASE 01/24/2007    Allergies[1]   Review of Systems:  No headache, visual changes, nausea, vomiting, diarrhea, constipation, dizziness, abdominal pain, skin rash, fevers, chills, night sweats, weight loss, swollen lymph nodes, body aches, joint swelling, chest pain, shortness of breath, mood changes. POSITIVE muscle aches  Objective  Blood pressure 110/70, pulse 94, height 5' 5 (1.651 m), weight 121 lb (54.9 kg), last menstrual period 03/31/2010, SpO2 97%.   General: No apparent distress alert and oriented x3 mood and affect normal, dressed appropriately.  HEENT: Pupils equal, extraocular movements intact  Respiratory: Patient's speak in full sentences and does not appear short of breath  Cardiovascular: No lower extremity edema, non tender, no erythema  Gait relatively normal MSK:  Back  does have some loss of lordosis noted.  Patient does have some tenderness in the parascapular area right greater than left.  Osteopathic findings  C2 flexed rotated and side bent right C6 flexed rotated and side bent left T3 extended rotated and side bent right inhaled rib T9 extended rotated and side bent left L2 flexed rotated and side bent right Sacrum right on right   An exam shows that patient does have a cyst formation between the middle and index finger of the left hand.  Procedure: Real-time Ultrasound Guided Injection of third flexor tendon sheath left hand Device: GE Logiq Q7 Ultrasound guided injection is preferred based studies that show increased duration, increased effect, greater accuracy, decreased procedural pain, increased response rate, and decreased cost with ultrasound guided versus blind injection.  Verbal informed consent obtained.  Time-out conducted.  Noted no overlying erythema, induration, or other signs of local infection.  Skin prepped in a sterile fashion.  Local anesthesia: Topical Ethyl chloride.  With sterile technique and under real time ultrasound guidance: With a 25-gauge half inch needle injected with 0.5 cc of 0.5% Marcaine and 0.5 cc of Kenalog  40 mg/mL Completed without difficulty  Pain immediately resolved suggesting accurate placement of the medication.  Advised to call if fevers/chills, erythema, induration, drainage, or persistent bleeding.  Images saved Impression: Technically successful ultrasound guided injection.    Assessment and Plan:  Slipped rib syndrome Slipped rib syndrome noted.  Discussed icing regimen and home exercises, discussed which activities to do and which ones to avoid.  Increase activity slowly.  Discussed icing regimen.  Follow-up again in  6 to 12 weeks  Ganglion cyst of finger of left hand Patient did have the injection again.  Has had difficulty with a trigger finger as well and hoping that this would make a  significant improvement.  Discussed icing regimen and home exercises, which activities to do and which ones to avoid.  Bracing at night if needed.  Follow-up again in 8 to 12 weeks  Arthritis of carpometacarpal [facility]) joint of right thumb Stable and doing well after PRP    Nonallopathic problems  Decision today to treat with OMT was based on Physical Exam  After verbal consent patient was treated with HVLA, ME, FPR techniques in cervical, rib, thoracic, lumbar, and sacral  areas  Patient tolerated the procedure well with improvement in symptoms  Patient given exercises, stretches and lifestyle modifications  See medications in patient instructions if given  Patient will follow up in 4-8 weeks     The above documentation has been reviewed and is accurate and complete Ladona Rosten M Haskel Dewalt, DO         Note: This dictation was prepared with Dragon dictation along with smaller phrase technology. Any transcriptional errors that result from this process are unintentional.            [1] No Known Allergies  "

## 2024-03-07 NOTE — Patient Instructions (Addendum)
 Injected hand today Refilled celebrex  Keep using secret weapon See me in 2 months

## 2024-03-07 NOTE — Assessment & Plan Note (Signed)
 Stable and doing well after PRP

## 2024-03-07 NOTE — Assessment & Plan Note (Signed)
 Slipped rib syndrome noted.  Discussed icing regimen and home exercises, discussed which activities to do and which ones to avoid.  Increase activity slowly.  Discussed icing regimen.  Follow-up again in 6 to 12 weeks

## 2024-05-13 ENCOUNTER — Ambulatory Visit: Admitting: Family Medicine

## 2024-08-20 ENCOUNTER — Encounter: Admitting: Family Medicine

## 2024-08-27 ENCOUNTER — Encounter: Admitting: Family Medicine
